# Patient Record
Sex: Female | Born: 1975 | Race: White | Hispanic: No | Marital: Married | Smoking: Never smoker
Health system: Southern US, Community
[De-identification: ages and names within clinical notes are randomized; demographics above are authoritative.]

## PROBLEM LIST (undated history)

## (undated) DIAGNOSIS — N943 Premenstrual tension syndrome: Secondary | ICD-10-CM

## (undated) DIAGNOSIS — N946 Dysmenorrhea, unspecified: Secondary | ICD-10-CM

## (undated) DIAGNOSIS — F419 Anxiety disorder, unspecified: Secondary | ICD-10-CM

## (undated) DIAGNOSIS — Z309 Encounter for contraceptive management, unspecified: Secondary | ICD-10-CM

## (undated) DIAGNOSIS — L729 Follicular cyst of the skin and subcutaneous tissue, unspecified: Secondary | ICD-10-CM

## (undated) HISTORY — DX: Follicular cyst of the skin and subcutaneous tissue, unspecified: L72.9

## (undated) HISTORY — PX: NO PAST SURGERIES: SHX2092

## (undated) HISTORY — DX: Anxiety disorder, unspecified: F41.9

## (undated) HISTORY — DX: Encounter for contraceptive management, unspecified: Z30.9

## (undated) HISTORY — DX: Dysmenorrhea, unspecified: N94.6

## (undated) HISTORY — DX: Premenstrual tension syndrome: N94.3

## (undated) HISTORY — PX: FOOT SURGERY: SHX648

---

## 2008-05-05 ENCOUNTER — Ambulatory Visit: Payer: Self-pay | Admitting: Internal Medicine

## 2008-05-26 ENCOUNTER — Ambulatory Visit: Payer: Self-pay | Admitting: Internal Medicine

## 2008-05-26 ENCOUNTER — Ambulatory Visit (HOSPITAL_COMMUNITY): Admission: RE | Admit: 2008-05-26 | Discharge: 2008-05-26 | Payer: Self-pay | Admitting: Internal Medicine

## 2008-07-20 ENCOUNTER — Other Ambulatory Visit: Admission: RE | Admit: 2008-07-20 | Discharge: 2008-07-20 | Payer: Self-pay | Admitting: Obstetrics and Gynecology

## 2008-11-18 ENCOUNTER — Emergency Department (HOSPITAL_COMMUNITY): Admission: EM | Admit: 2008-11-18 | Discharge: 2008-11-18 | Payer: Self-pay | Admitting: Emergency Medicine

## 2009-05-02 ENCOUNTER — Emergency Department (HOSPITAL_COMMUNITY): Admission: EM | Admit: 2009-05-02 | Discharge: 2009-05-02 | Payer: Self-pay | Admitting: Emergency Medicine

## 2009-08-10 ENCOUNTER — Other Ambulatory Visit: Admission: RE | Admit: 2009-08-10 | Discharge: 2009-08-10 | Payer: Self-pay | Admitting: Obstetrics and Gynecology

## 2010-08-27 LAB — CBC
HCT: 39.5 % (ref 36.0–46.0)
Hemoglobin: 13.8 g/dL (ref 12.0–15.0)
MCHC: 34.9 g/dL (ref 30.0–36.0)
MCV: 90.2 fL (ref 78.0–100.0)
RBC: 4.38 MIL/uL (ref 3.87–5.11)
RDW: 13.2 % (ref 11.5–15.5)

## 2010-08-27 LAB — BASIC METABOLIC PANEL
CO2: 27 mEq/L (ref 19–32)
Chloride: 101 mEq/L (ref 96–112)
GFR calc Af Amer: >60 mL/min (ref >60)
Glucose, Bld: 92 mg/dL (ref 70–99)
Potassium: 3.7 mEq/L (ref 3.5–5.1)
Sodium: 135 mEq/L (ref 135–145)

## 2010-08-27 LAB — GLUCOSE, CAPILLARY: Glucose-Capillary: 96 mg/dL (ref 70–99)

## 2010-08-27 LAB — DIFFERENTIAL
Basophils Relative: 0 % (ref 0–1)
Eosinophils Relative: 0 % (ref 0–5)
Monocytes Absolute: 0.5 10*3/uL (ref 0.1–1.0)
Monocytes Relative: 8 % (ref 3–12)
Neutro Abs: 3.9 10*3/uL (ref 1.7–7.7)

## 2010-08-27 LAB — URINALYSIS, ROUTINE W REFLEX MICROSCOPIC
Glucose, UA: NEGATIVE mg/dL
Hgb urine dipstick: NEGATIVE
Ketones, ur: NEGATIVE mg/dL
Protein, ur: NEGATIVE mg/dL
Urobilinogen, UA: 0.2 mg/dL (ref 0.0–1.0)

## 2010-10-02 NOTE — H&P (Signed)
NAME:  Ann Campbell, Ann Campbell                ACCOUNT NO.:  [id number]   MEDICAL RECORD NO.:  [id number]          PATIENT TYPE:  AMB   LOCATION:  DAY                           FACILITY:  [facility]   PHYSICIAN:  R. Roetta Sessions, M.D. DATE OF BIRTH:  1975-09-23   DATE OF ADMISSION:  DATE OF DISCHARGE:  LH                              HISTORY & PHYSICAL   CHIEF COMPLAINT:  History of anal fissure, having issues with it.   HISTORY OF PRESENT ILLNESS:  Ann Campbell is a 35 year old lady, who presents  with a self-referral today for further evaluation of history of anal  fissure.  Approximately 15 months ago after she had her second child,  she developed an anorectal fissure.  She states that this occurred about  2 weeks after giving birth.  She had painful bowel movements at that  time.  She denies having the episiotomy with either one of her vaginal  deliveries.  She tried multiple topical agents without any relief.  Around June 2009, she was referred to a surgeon and had fissurectomy.  Two days afterwards, she developed abscess, which required an I&D.  Around August 2009, she states she had another surgery for abscess  drainage and possibly extension of her fissurectomy.  She states that  she was told she have to consider a colonoscopy to rule out Crohn  disease.  She followed up with her surgeon about a month ago and was  told that she was feeling nicely.  She moved back to the area from  [place] and will need to establish care with a practice.  She states  she continues to have frequent soreness and itching around the anorectal  opening.  She notes it is worse with exercise.  Three weeks ago, she did  have some drainage noted, which she wiped with her toilet tissue.  She  occasionally does have some bright red blood per rectum.  Her bowel  movements has always been quite frequent.  She may have anywhere from 2-  4 stools daily, but they are usually soft.  Denies any fecal  incontinence.  No pain with  defecation.  No abdominal pain, nausea, or  vomiting.  She has intermittent heartburn due to dietary indiscretions.  She takes Zantac occasionally.  Her weight has been stable.   CURRENT MEDICATIONS:  Zoloft 25 mg daily, multivitamin daily, and Zantac  occasionally.   ALLERGIES:  SULFA causes rash.   PAST MEDICAL HISTORY:  Anxiety and depression.   PAST SURGICAL HISTORY:  Anal fissure as above.  She has had a D&C as  well.   FAMILY HISTORY:  Negative for colorectal cancer.   SOCIAL HISTORY:  She is married.  She has 2 children.  Her youngest is  49 months old.  She is employed as a Designer, multimedia.  She is a  nonsmoker.  Occasionally consumes beer or wine.   REVIEW OF SYSTEMS:  GI:  See HPI.  CONSTITUTIONAL:  No weight loss.  CARDIOPULMONARY:  No chest pain, shortness of breath, palpitations, or  cough.  GENITOURINARY:  No dysuria or hematuria.   PHYSICAL  EXAMINATION:  VITAL SIGNS:  Weight 138, height 5 feet 5 inches,  temperature 97.7, blood pressure 100/78, and pulse 72.  GENERAL:  Pleasant, thin, Caucasian female, in no acute distress.  SKIN:  Warm and dry.  No jaundice.  HEENT:  Sclera nonicteric.  Oropharyngeal mucosa moist and pink.  No  lesions, erythema, or exudates.  No lymphadenopathy or thyromegaly.  CHEST:  Lungs are clear to auscultation.  CARDIAC:  Regular rate and rhythm.  Normal S1 and S2.  No murmurs, rubs,  or gallops.  ABDOMEN:  Positive bowel sound.  Abdomen is soft, nontender, and  nondistended.  No organomegaly or masses.  No rebound or guarding.  No  abdominal bruits or hernias.  RECTAL:  Small skin tag at 12 o'clock.  At 6 o'clock, there is a prior  fissurectomy site with evidence of sphincterotomy with some weakness at  that area.  With mild digital pressure, anorectal tissue was apparent  and is beefy red and bleeds easily.  There seems to be a small pocket at  this area prior to entering into the rectum.  Otherwise, rectum is  normal.  Some  engorged blood on the glove after exam.  She has  tenderness to palpation at the prior fissurectomy site.  LOWER EXTREMITIES:  No edema.   IMPRESSION:  Ann Campbell is a 35 year old lady with history of anorectal  fissure requiring fissurectomy, complicated by abscess development,  requiring drainage and by description, extension of the fissurectomy.  She continues to have ongoing issues with intermittent bleeding and  drainage as well as soreness and itching.  Bowel movements are regular  up to 2-4 times a day with soft stools.  She does not give any history  of fistulas.  Given ongoing symptoms and intermittent bright blood per  rectum, Dr. Jena Gauss has advised colonoscopy to rule out any other  underlying etiologies such as inflammatory bowel disease.  If her  colonoscopy is unremarkable, she may need to have a referral to surgeon  here likely for further evaluation.   PLAN:  1. Colonoscopy with Dr. Jena Gauss in the near future.  2. AnaMantle HC Forte apply anorectally b.i.d. p.r.n. anal discomfort,      #28, 0 refills.  3. Further recommendation is to follow.      Tana Coast, P.AJonathon Bellows, M.D.  Electronically Signed    LL/MEDQ  D:  05/05/2008  T:  05/06/2008  Job:  [id number]   cc:   Kingsley Callander. Ouida Sills, MD  Fax: [phone]

## 2010-10-02 NOTE — Op Note (Signed)
NAME:  Ann Campbell, Ann Campbell                ACCOUNT NO.:  [id number]   MEDICAL RECORD NO.:  [id number]          PATIENT TYPE:  AMB   LOCATION:  DAY                           FACILITY:  [facility]   PHYSICIAN:  R. Roetta Sessions, M.D. DATE OF BIRTH:  04-May-1976   DATE OF PROCEDURE:  05/26/2008  DATE OF DISCHARGE:                               OPERATIVE REPORT   INDICATIONS FOR PROCEDURE:  A 35 year old lady with intermittent rectal  bleeding and pain after having a fissurectomy and drainage,  postoperative abscess in [place].  She continues to have symptoms of  bleeding and drainage and perianal soreness.  We saw her in the office  on May 05, 2008, and started on some AnaMantle Forte, which is  modestly improved her symptoms.  She is here for colonoscopy, rule out  other causes of her rectal bleeding.  This approach has been discussed  with the patient at length.  Risks, benefits, alternatives, and  limitations have been discussed.  Please see the documentation in the  medical record.   PROCEDURE NOTE:  O2 saturation, blood pressure, pulse, and respirations  were monitored throughout the entire procedure.   CONSCIOUS SEDATION:  Versed 5 mg IV and Demerol 125 mg IV in divided  doses.   INSTRUMENT:  Pentax video chip system.   FINDINGS:  Digital rectal exam revealed a small external tag.  The  digital exam was exclusively tender.  For Ms. Ferd Glassing, no mass  appreciated.  Endoscopic Findings:  The prep was good.  Colon:  Colonic  mucosa was surveyed from the rectosigmoid junction through the left  transverse right colon, appendiceal orifice, ileocecal valve, and cecum.  These structures were well seen and photographed for the record.  Terminal ileum was intubated 10 cm from this level, scope was slowly and  cautiously withdrawn.  All previously mentioned mucosal surfaces were  again seen.  The colonic mucosa appeared entirely normal as did in  terminal ileum mucosa.  Scope was pulled down  the rectum.  Thorough  examination of the rectal mucosa including retroflex view of anal verge  demonstrated a couple of anal papillae.  Otherwise, rectal mucosa  appeared entirely normal.  En face view of the anal canal demonstrated  some postoperative changes only.  The patient tolerated the procedure  very well and was reactive in Endoscopy.   IMPRESSION:  Anal papilla, single external hemorrhoidal tag, exclusively  tender anal canal (persisting fissure, infectious process not excluded  at this time).  Otherwise normal rectum:  Terminal ileum.   Today's findings are reassuring.  She does not have any pathology up in  her rectum:  Terminal ileum.  I do not believe, there is no evidence of  Crohn disease.  At this time, I suspect that she is having ongoing  issues with  complicated anal fissure surgery as previously discussed if her  colonoscopy is unremarkable, I feel the best approach was fairly to get  her to see a surgeon locally.  We will see if we can get her to see Dr.  Lovell Sheehan in regards to management of postoperative anal  fissure issues.      Jonathon Bellows, M.D.  Electronically Signed     RMR/MEDQ  D:  05/26/2008  T:  05/26/2008  Job:  [id number]   cc:   Kingsley Callander. Ouida Sills, MD  Fax: [phone]   Dalia Heading, M.D.  Fax: [phone]

## 2012-02-24 ENCOUNTER — Other Ambulatory Visit: Payer: Self-pay | Admitting: Adult Health

## 2012-02-24 ENCOUNTER — Other Ambulatory Visit (HOSPITAL_COMMUNITY)
Admission: RE | Admit: 2012-02-24 | Discharge: 2012-02-24 | Disposition: A | Discharge status: Home / Self Care | Payer: Managed Care, Other (non HMO) | Source: Ambulatory Visit | Attending: Obstetrics and Gynecology | Admitting: Obstetrics and Gynecology

## 2012-02-24 DIAGNOSIS — Z01419 Encounter for gynecological examination (general) (routine) without abnormal findings: Secondary | ICD-10-CM | POA: Insufficient documentation

## 2012-12-30 ENCOUNTER — Emergency Department (HOSPITAL_COMMUNITY)
Admission: EM | Admit: 2012-12-30 | Discharge: 2012-12-30 | Disposition: A | Discharge status: Home / Self Care | Payer: BC Managed Care – PPO | Attending: Emergency Medicine | Admitting: Emergency Medicine

## 2012-12-30 ENCOUNTER — Encounter (HOSPITAL_COMMUNITY): Payer: Self-pay | Admitting: Emergency Medicine

## 2012-12-30 DIAGNOSIS — Y929 Unspecified place or not applicable: Secondary | ICD-10-CM | POA: Insufficient documentation

## 2012-12-30 DIAGNOSIS — Z79899 Other long term (current) drug therapy: Secondary | ICD-10-CM | POA: Insufficient documentation

## 2012-12-30 DIAGNOSIS — L299 Pruritus, unspecified: Secondary | ICD-10-CM | POA: Insufficient documentation

## 2012-12-30 DIAGNOSIS — T63461A Toxic effect of venom of wasps, accidental (unintentional), initial encounter: Secondary | ICD-10-CM | POA: Insufficient documentation

## 2012-12-30 DIAGNOSIS — T6391XA Toxic effect of contact with unspecified venomous animal, accidental (unintentional), initial encounter: Secondary | ICD-10-CM | POA: Insufficient documentation

## 2012-12-30 DIAGNOSIS — Y939 Activity, unspecified: Secondary | ICD-10-CM | POA: Insufficient documentation

## 2012-12-30 DIAGNOSIS — R110 Nausea: Secondary | ICD-10-CM | POA: Insufficient documentation

## 2012-12-30 DIAGNOSIS — IMO0002 Reserved for concepts with insufficient information to code with codable children: Secondary | ICD-10-CM | POA: Insufficient documentation

## 2012-12-30 MED ORDER — DIPHENHYDRAMINE HCL 50 MG/ML IJ SOLN
INTRAMUSCULAR | Status: AC
  Administered 2012-12-30: 25 mg
  Filled 2012-12-30: qty 1

## 2012-12-30 MED ORDER — EPINEPHRINE 0.3 MG/0.3ML IJ SOAJ: 0.3000 mg | INTRAMUSCULAR | Status: DC | PRN

## 2012-12-30 MED ORDER — DIPHENHYDRAMINE HCL 25 MG PO TABS: 25.0000 mg | ORAL_TABLET | Freq: Four times a day (QID) | ORAL | Status: DC

## 2012-12-30 MED ORDER — FAMOTIDINE IN NACL 20-0.9 MG/50ML-% IV SOLN
INTRAVENOUS | Status: AC
  Administered 2012-12-30: 20 mg
  Filled 2012-12-30: qty 50

## 2012-12-30 MED ORDER — ONDANSETRON HCL 4 MG/2ML IJ SOLN
4.0000 mg | Freq: Once | INTRAMUSCULAR | Status: AC
  Administered 2012-12-30: 4 mg via INTRAVENOUS
  Filled 2012-12-30: qty 2

## 2012-12-30 MED ORDER — PREDNISONE 20 MG PO TABS: ORAL_TABLET | ORAL | Status: DC

## 2012-12-30 MED ORDER — RANITIDINE HCL 150 MG PO TABS: 150.0000 mg | ORAL_TABLET | Freq: Two times a day (BID) | ORAL | Status: DC

## 2012-12-30 MED ORDER — METHYLPREDNISOLONE SODIUM SUCC 125 MG IJ SOLR
INTRAMUSCULAR | Status: AC
  Administered 2012-12-30: 125 mg
  Filled 2012-12-30: qty 2

## 2012-12-30 MED ORDER — EPINEPHRINE 0.3 MG/0.3ML IJ SOAJ
0.3000 mg | Freq: Once | INTRAMUSCULAR | Status: AC
  Administered 2012-12-30: 0.3 mg via INTRAMUSCULAR
  Filled 2012-12-30: qty 0.3

## 2012-12-30 MED ORDER — SODIUM CHLORIDE 0.9 % IV BOLUS (SEPSIS)
1000.0000 mL | Freq: Once | INTRAVENOUS | Status: AC
  Administered 2012-12-30: 500 mL via INTRAVENOUS

## 2012-12-30 NOTE — ED Notes (Signed)
Pt states stepped on bee to left foot around 5pm per pt. Took about 20ml of childrens benadryl pta. Pt arrived with red bumps and welts all over body. Stated " my throat feels thick" no resp distress or trouble swallowing but states it is a little hard to swallow. Nad.

## 2012-12-30 NOTE — ED Notes (Signed)
Pt denies complaints at present time.  Awaiting discharge.

## 2012-12-30 NOTE — ED Notes (Signed)
Other bag hanging for full bolus. Pt rash almost gone.

## 2012-12-30 NOTE — ED Provider Notes (Signed)
CSN: [id number]     Arrival date & time 12/30/12  1749 History     First MD Initiated Contact with Patient 12/30/12 1800     Chief Complaint  Patient presents with  . Allergic Reaction   (Consider location/radiation/quality/duration/timing/severity/associated sxs/prior Treatment) Patient is a 37 y.o. female presenting with allergic reaction. The history is provided by the patient. No language interpreter was used.  Allergic Reaction Presenting symptoms: itching and rash   Presenting symptoms: no wheezing   Presenting symptoms comment:  Sensation of throat closing, nausea Itching:    Location:  Full body   Severity:  Severe   Onset quality:  Sudden   Duration:  1 hour   Timing:  Constant   Progression:  Unchanged Rash:    Location:  Full body   Quality: itchiness and redness     Onset quality:  Sudden   Duration:  1 hour   Timing:  Constant   Progression:  Unchanged Severity:  Severe Prior allergic episodes:  No prior episodes Context: insect bite/sting (stepped on a bee)   Relieved by:  Nothing Worsened by:  Nothing tried Ineffective treatments: PO benadryl.   History reviewed. No pertinent past medical history. History reviewed. No pertinent past surgical history. History reviewed. No pertinent family history. History  Substance Use Topics  . Smoking status: Never Smoker   . Smokeless tobacco: Not on file  . Alcohol Use: Yes     Comment: occ   OB History   Grav Para Term Preterm Abortions TAB SAB Ect Mult Living                 Review of Systems  Constitutional: Negative for fever, chills, diaphoresis, activity change, appetite change and fatigue.  HENT: Negative for congestion, sore throat, facial swelling, rhinorrhea, neck pain and neck stiffness.   Eyes: Negative for photophobia and discharge.  Respiratory: Negative for cough, chest tightness, shortness of breath and wheezing.   Cardiovascular: Negative for chest pain, palpitations and leg swelling.   Gastrointestinal: Positive for nausea. Negative for vomiting, abdominal pain and diarrhea.  Endocrine: Negative for polydipsia and polyuria.  Genitourinary: Negative for dysuria, frequency, difficulty urinating and pelvic pain.  Musculoskeletal: Negative for back pain and arthralgias.  Skin: Positive for itching and rash. Negative for color change and wound.  Allergic/Immunologic: Negative for immunocompromised state.  Neurological: Negative for facial asymmetry, weakness, numbness and headaches.  Hematological: Does not bruise/bleed easily.  Psychiatric/Behavioral: Negative for confusion and agitation.    Allergies  Sulfa antibiotics  Home Medications   Current Outpatient Rx  Name  Route  Sig  Dispense  Refill  . sertraline (ZOLOFT) 25 MG tablet   Oral   Take 25 mg by mouth daily.         . diphenhydrAMINE (BENADRYL) 25 MG tablet   Oral   Take 1 tablet (25 mg total) by mouth every 6 (six) hours. For three days   20 tablet   0   . EPINEPHrine (EPIPEN) 0.3 mg/0.3 mL SOAJ injection   Intramuscular   Inject 0.3 mL (0.3 mg total) into the muscle as needed.   2 Device   2   . predniSONE (DELTASONE) 20 MG tablet      2 tabs po daily x 3 days   6 tablet   0   . ranitidine (ZANTAC) 150 MG tablet   Oral   Take 1 tablet (150 mg total) by mouth 2 (two) times daily.   6 tablet  0    BP 108/69  Pulse 86  Temp(Src) 97.8 F (36.6 C) (Oral)  Resp 20  Ht 5\' 5"  (1.651 m)  Wt 140 lb (63.504 kg)  BMI 23.3 kg/m2  SpO2 97%  LMP 12/16/2012 Physical Exam  Constitutional: She is oriented to person, place, and time. She appears well-developed and well-nourished. No distress.  HENT:  Head: Normocephalic and atraumatic.  Mouth/Throat: No oropharyngeal exudate.  Eyes: Pupils are equal, round, and reactive to light.  Neck: Normal range of motion. Neck supple.  Cardiovascular: Normal rate, regular rhythm and normal heart sounds.  Exam reveals no gallop and no friction rub.   No  murmur heard. Pulmonary/Chest: Effort normal and breath sounds normal. No respiratory distress. She has no decreased breath sounds. She has no wheezes. She has no rhonchi. She has no rales.  Abdominal: Soft. Bowel sounds are normal. She exhibits no distension and no mass. There is no tenderness. There is no rebound and no guarding.  Musculoskeletal: Normal range of motion. She exhibits no edema and no tenderness.  Neurological: She is alert and oriented to person, place, and time.  Skin: Skin is warm and dry. Rash noted. Rash is urticarial (generalized).  Psychiatric: She has a normal mood and affect.    ED Course   Procedures (including critical care time)  Labs Reviewed - No data to display No results found. 1. Bee sting-induced anaphylaxis, initial encounter     9:53 PM Pt feeling well, rash much improved.  No ab pain, n/v, throat swelling.    MDM  Pt is a 37 y.o. female with Pmhx as above who presents with generalized pruritic hives, sensation of throat swelling, and upset stomach after stepping on a bee about 1 hr ago.  Initial VS stable, lungs clear, no stridor, abdomen non-tender, oropharynx clear.  Urticaria is generalized.  Given multiple organ systems seem to be involved, will treat for anaphylaxis. Have ordered IM epipen, benadryl, solumedrol, pepcid, 1L NS, zofran which lead to resolution of symptoms.  Pt monitored for 4 hours w/o return of symptoms.  Rx given for epipen, as well as 3 day course of prednisone, zantac, benadryl.  Strict return precautions given for returning symptoms.  Pt will f/u with PCP to discuss formal allergy testing.    1. Bee sting-induced anaphylaxis, initial encounter       Shanna Cisco, MD 12/30/12 2156

## 2012-12-30 NOTE — ED Notes (Signed)
Patient is resting comfortably.  No distress noted.  Pt reporting improvement in rash.  No itching or SOB at present time.  Tolerating p.o fluids with no difficulty.  VS stable.  Denies needs at present time.

## 2012-12-30 NOTE — Discharge Instructions (Signed)
Epinephrine injection (Auto-injector) What is this medicine? EPINEPHRINE (ep i NEF rin) is used for the emergency treatment of severe allergic reactions. You should keep this medicine with you at all times. This medicine may be used for other purposes; ask your health care provider or pharmacist if you have questions. What should I tell my health care provider before I take this medicine? They need to know if you have any of the following conditions: -an unusual or allergic reaction to epinephrine, sulfites, other medicines, foods, dyes, or preservatives -pregnant or trying to get pregnant -breast-feeding How should I use this medicine? This medicine is for injection into the outer thigh. Your doctor or health care professional will instruct you on the proper use of the device during an emergency. Read all directions carefully and make sure you understand them. Do not use more often than directed. Talk to your pediatrician regarding the use of this medicine in children. Special care may be needed. This drug is commonly used in children. A special device is available for use in children. Overdosage: If you think you have taken too much of this medicine contact a poison control center or emergency room at once. NOTE: This medicine is only for you. Do not share this medicine with others. What if I miss a dose? This does not apply. You should only use this medicine for an allergic reaction. What may interact with this medicine? This medicine is only used during an emergency. Significant drug interactions are not likely during emergency use. This list may not describe all possible interactions. Give your health care provider a list of all the medicines, herbs, non-prescription drugs, or dietary supplements you use. Also tell them if you smoke, drink alcohol, or use illegal drugs. Some items may interact with your medicine. What should I watch for while using this medicine? Keep this medicine ready for  use in the case of a severe allergic reaction. Make sure that you have the phone number of your doctor or health care professional and local hospital ready. Remember to check the expiration date of your medicine regularly. You may need to have additional units of this medicine with you at work, school, or other places. Talk to your doctor or health care professional about your need for extra units. Some emergencies may require an additional dose. Check with your doctor or a health care professional before using an extra dose. After use, go to the nearest hospital or call 911. Avoid physical activity. Make sure the treating health care professional knows you have received an injection of this medicine. You will receive additional instructions on what to do during and after use of this medicine before a medical emergency occurs. What side effects may I notice from receiving this medicine? Side effects that you should report to your doctor or health care professional as soon as possible: -allergic reactions like skin rash, itching or hives, swelling of the face, lips, or tongue -breathing problems -chest pain -flushing -irregular or pounding heartbeat -numbness in fingers or toes -vomiting Side effects that usually do not require medical attention (report to your doctor or health care professional if they continue or are bothersome): -anxiety or nervousness -dizzy, drowsy -dry mouth -headache -increased sweating -nausea -tired, weak This list may not describe all possible side effects. Call your doctor for medical advice about side effects. You may report side effects to FDA at 1-800-FDA-1088. Where should I keep my medicine? Keep out of the reach of children. Store at room temperature between  15 and 30 degrees C (59 and 86 degrees F). Protect from light and heat. The solution should be clear in color. If the solution is discolored or contains particles it must be replaced. Throw away any unused  medicine after the expiration date. Ask your doctor or pharmacist about proper disposal of the injector if it is expired or has been used. Always replace your auto-injector before it expires. NOTE: This sheet is a summary. It may not cover all possible information. If you have questions about this medicine, talk to your doctor, pharmacist, or health care provider.  2012, [employer]/Gold Standard. (09/08/2007 4:32:55 PM)Anaphylactic Reaction An anaphylactic reaction is a sudden, severe allergic reaction that involves the whole body. It can be life threatening. A hospital stay is often required. People with asthma, eczema, or hay fever are slightly more likely to have an anaphylactic reaction. CAUSES  An anaphylactic reaction may be caused by anything to which you are allergic. After being exposed to the allergic substance, your immune system becomes sensitized to it. When you are exposed to that allergic substance again, an allergic reaction can occur. Common causes of an anaphylactic reaction include:  Medicines.  Foods, especially peanuts, wheat, shellfish, milk, and eggs.  Insect bites or stings.  Blood products.  Chemicals, such as dyes, latex, and contrast material used for imaging tests. SYMPTOMS  When an allergic reaction occurs, the body releases histamine and other substances. These substances cause symptoms such as tightening of the airway. Symptoms often develop within seconds or minutes of exposure. Symptoms may include:  Skin rash or hives.  Itching.  Chest tightness.  Swelling of the eyes, tongue, or lips.  Trouble breathing or swallowing.  Lightheadedness or fainting.  Anxiety or confusion.  Stomach pains, vomiting, or diarrhea.  Nasal congestion.  A fast or irregular heartbeat (palpitations). DIAGNOSIS  Diagnosis is based on your history of recent exposure to allergic substances, your symptoms, and a physical exam. Your caregiver may also perform blood or urine  tests to confirm the diagnosis. TREATMENT  Epinephrine medicine is the main treatment for an anaphylactic reaction. Other medicines that may be used for treatment include antihistamines, steroids, and albuterol. In severe cases, fluids and medicine to support blood pressure may be given through an intravenous line (IV). Even if you improve after treatment, you need to be observed to make sure your condition does not get worse. This may require a stay in the hospital. HOME CARE INSTRUCTIONS   Wear a medical alert bracelet or necklace stating your allergy.  You and your family must learn how to use an anaphylaxis kit or give an epinephrine injection to temporarily treat an emergency allergic reaction. Always carry your epinephrine injection or anaphylaxis kit with you. This can be lifesaving if you have a severe reaction.  Do not drive or perform tasks after treatment until the medicines used to treat your reaction have worn off, or until your caregiver says it is okay.  If you have hives or a rash:  Take medicines as directed by your caregiver.  You may use an over-the-counter antihistamine (diphenhydramine) as needed.  Apply cold compresses to the skin or take baths in cool water. Avoid hot baths or showers. SEEK MEDICAL CARE IF:   You develop symptoms of an allergic reaction to a new substance. Symptoms may start right away or minutes later.  You develop a rash, hives, or itching.  You develop new symptoms. SEEK IMMEDIATE MEDICAL CARE IF:   You have swelling of the  mouth, difficulty breathing, or wheezing.  You have a tight feeling in your chest or throat.  You develop hives, swelling, or itching all over your body.  You develop severe vomiting or diarrhea.  You feel faint or pass out. This is an emergency. Use your epinephrine injection or anaphylaxis kit as you have been instructed. Call your local emergency services (911 in [place] Even if you improve after the injection, you  need to be examined at a hospital emergency department. MAKE SURE YOU:   Understand these instructions.  Will watch your condition.  Will get help right away if you are not doing well or get worse. Document Released: 05/06/2005 Document Revised: 11/05/2011 Document Reviewed: 08/07/2011 [facility] Patient Information 2014 [place], [place].

## 2013-03-05 ENCOUNTER — Telehealth: Payer: Self-pay | Admitting: *Deleted

## 2013-03-05 NOTE — Telephone Encounter (Signed)
Pt states has a small lump at collar bone, not hard, movable. Pt has an appt with Cyril Mourning, NP to be evaluated.

## 2013-03-08 ENCOUNTER — Other Ambulatory Visit: Payer: Self-pay | Admitting: Adult Health

## 2013-03-10 ENCOUNTER — Encounter: Payer: Self-pay | Admitting: Adult Health

## 2013-03-10 ENCOUNTER — Ambulatory Visit (INDEPENDENT_AMBULATORY_CARE_PROVIDER_SITE_OTHER): Payer: BC Managed Care – PPO | Admitting: Adult Health

## 2013-03-10 VITALS — BP 130/80 | Ht 65.0 in | Wt 145.0 lb

## 2013-03-10 DIAGNOSIS — F411 Generalized anxiety disorder: Secondary | ICD-10-CM

## 2013-03-10 DIAGNOSIS — F419 Anxiety disorder, unspecified: Secondary | ICD-10-CM

## 2013-03-10 DIAGNOSIS — L729 Follicular cyst of the skin and subcutaneous tissue, unspecified: Secondary | ICD-10-CM

## 2013-03-10 DIAGNOSIS — L723 Sebaceous cyst: Secondary | ICD-10-CM

## 2013-03-10 HISTORY — DX: Follicular cyst of the skin and subcutaneous tissue, unspecified: L72.9

## 2013-03-10 MED ORDER — SERTRALINE HCL 25 MG PO TABS: ORAL_TABLET | ORAL | Status: DC

## 2013-03-10 NOTE — Progress Notes (Signed)
Subjective:     Patient ID: Ann Campbell, female   DOB: 1975-09-07, 37 y.o.   MRN: [id number]  HPI Ann Campbell is a 37 year old white female married in complaining of nodule at right clavicle.and she needs refill on zoloft, which is working well.  Review of Systems See HPI Reviewed past medical,surgical, social and family history. Reviewed medications and allergies.     Objective:   Physical Exam BP 130/80  Ht 5\' 5"  (1.651 m)  Wt 145 lb (65.772 kg)  BMI 24.13 kg/m2  LMP 02/13/2013   Skin warm and dry, has 1.5 cm spongy mobile nodule at right clavicle that moves easily, non tender has been there about 4-5 days. Assessment:     Skin cyst Anxiety     Plan:     Make physical appt in near future Refer to Dr Lovell Sheehan to evaluate Refilled zoloft 25 mg x 1year

## 2013-03-10 NOTE — Patient Instructions (Signed)
Make physical appt in future Refer to Dr Lovell Sheehan to evaluate cyst

## 2013-05-24 ENCOUNTER — Ambulatory Visit (INDEPENDENT_AMBULATORY_CARE_PROVIDER_SITE_OTHER): Payer: BC Managed Care – PPO | Admitting: Obstetrics and Gynecology

## 2013-05-24 ENCOUNTER — Encounter (INDEPENDENT_AMBULATORY_CARE_PROVIDER_SITE_OTHER): Payer: Self-pay

## 2013-05-24 ENCOUNTER — Encounter: Payer: Self-pay | Admitting: Obstetrics and Gynecology

## 2013-05-24 VITALS — BP 110/78 | Ht 65.0 in | Wt 147.0 lb

## 2013-05-24 DIAGNOSIS — N898 Other specified noninflammatory disorders of vagina: Secondary | ICD-10-CM

## 2013-05-24 DIAGNOSIS — R300 Dysuria: Secondary | ICD-10-CM

## 2013-05-24 LAB — POCT URINALYSIS DIPSTICK
Blood, UA: NEGATIVE
Glucose, UA: NEGATIVE
KETONES UA: NEGATIVE
LEUKOCYTES UA: NEGATIVE
NITRITE UA: NEGATIVE
PROTEIN UA: NEGATIVE

## 2013-05-24 LAB — POCT WET PREP WITH KOH
Bacteria Wet Prep HPF POC: NEGATIVE
KOH PREP POC: NEGATIVE
TRICHOMONAS UA: NEGATIVE
YEAST WET PREP PER HPF POC: NEGATIVE

## 2013-05-24 MED ORDER — PHENAZOPYRIDINE HCL 200 MG PO TABS: 200.0000 mg | ORAL_TABLET | Freq: Three times a day (TID) | ORAL | Status: DC | PRN

## 2013-05-24 MED ORDER — NITROFURANTOIN MONOHYD MACRO 100 MG PO CAPS: 100.0000 mg | ORAL_CAPSULE | Freq: Two times a day (BID) | ORAL | Status: DC

## 2013-05-24 NOTE — Progress Notes (Signed)
Subjective:     Patient ID: Ann Campbell, female   DOB: [birth year], 38 y.o.   MRN: [id number]  HPI  Ann Campbell is a 38 y.o. female who presents with new episodes of frequency that began in the last few days. She states having a history of similar symptoms in the past, especially when she takes baths. She reports being sexually active in the last few days but denies experiencing any dyspareunia. She states her birth control method is condoms. She denies dysuria.    Review of Systems  Genitourinary: Positive for frequency. Negative for dysuria, difficulty urinating, vaginal pain, menstrual problem, pelvic pain and dyspareunia.   Past Medical History  Diagnosis Date  . Anxiety   . Skin cyst 03/10/2013    Right clavicle area very mobile   Past Surgical History  Procedure Laterality Date  . No past surgeries      Prior to Admission medications   Medication Sig Start Date End Date Taking? Authorizing Provider  EPINEPHrine (EPIPEN) 0.3 mg/0.3 mL SOAJ injection Inject 0.3 mL (0.3 mg total) into the muscle as needed. 12/30/12  Yes Neta Ehlers, MD  sertraline (ZOLOFT) 25 MG tablet TAKE ONE TABLET BY MOUTH ONCE DAILY 03/10/13  Yes Estill Dooms, NP  diphenhydrAMINE (BENADRYL) 25 MG tablet Take 1 tablet (25 mg total) by mouth every 6 (six) hours. For three days 12/30/12 01/02/13  Neta Ehlers, MD   Allergies  Allergen Reactions  . Bee Venom   . Sulfa Antibiotics         Objective:   Physical Exam  Nursing note and vitals reviewed. Constitutional: She is oriented to person, place, and time. She appears well-developed and well-nourished.  HENT:  Head: Normocephalic and atraumatic.  Cardiovascular: Normal rate.   Pulmonary/Chest: Effort normal. No respiratory distress.  Abdominal: She exhibits no distension.  Genitourinary: Vagina normal and uterus normal. No vaginal discharge found.  Bladder slightly sensitive. Uterus, tubes and ovaries are non tender and normal.    Neurological: She is alert and oriented to person, place, and time.  Skin: Skin is warm and dry.  Psychiatric: She has a normal mood and affect.   KOH negative for yeast. Wet prep negative.       Assessment:     Early UTI    Plan:     Plan to prescribe Macrobid 100 mg twice daily.

## 2013-05-24 NOTE — Patient Instructions (Signed)
Rx for pyridium and macrobid to be called to [facility]

## 2014-03-21 ENCOUNTER — Encounter: Payer: Self-pay | Admitting: Obstetrics and Gynecology

## 2014-04-15 ENCOUNTER — Other Ambulatory Visit: Payer: Self-pay | Admitting: Adult Health

## 2014-07-20 ENCOUNTER — Other Ambulatory Visit (HOSPITAL_COMMUNITY)
Admission: RE | Admit: 2014-07-20 | Discharge: 2014-07-20 | Disposition: A | Discharge status: Home / Self Care | Payer: BLUE CROSS/BLUE SHIELD | Source: Ambulatory Visit | Attending: Adult Health | Admitting: Adult Health

## 2014-07-20 ENCOUNTER — Ambulatory Visit (INDEPENDENT_AMBULATORY_CARE_PROVIDER_SITE_OTHER): Payer: BLUE CROSS/BLUE SHIELD | Admitting: Adult Health

## 2014-07-20 ENCOUNTER — Encounter: Payer: Self-pay | Admitting: Adult Health

## 2014-07-20 VITALS — BP 130/80 | HR 74 | Ht 64.75 in | Wt 159.0 lb

## 2014-07-20 DIAGNOSIS — F419 Anxiety disorder, unspecified: Secondary | ICD-10-CM

## 2014-07-20 DIAGNOSIS — Z3041 Encounter for surveillance of contraceptive pills: Secondary | ICD-10-CM | POA: Insufficient documentation

## 2014-07-20 DIAGNOSIS — Z309 Encounter for contraceptive management, unspecified: Secondary | ICD-10-CM

## 2014-07-20 DIAGNOSIS — Z1151 Encounter for screening for human papillomavirus (HPV): Secondary | ICD-10-CM | POA: Insufficient documentation

## 2014-07-20 DIAGNOSIS — Z01419 Encounter for gynecological examination (general) (routine) without abnormal findings: Secondary | ICD-10-CM | POA: Diagnosis not present

## 2014-07-20 DIAGNOSIS — Z30011 Encounter for initial prescription of contraceptive pills: Secondary | ICD-10-CM

## 2014-07-20 DIAGNOSIS — N943 Premenstrual tension syndrome: Secondary | ICD-10-CM

## 2014-07-20 DIAGNOSIS — N946 Dysmenorrhea, unspecified: Secondary | ICD-10-CM

## 2014-07-20 HISTORY — DX: Dysmenorrhea, unspecified: N94.6

## 2014-07-20 HISTORY — DX: Premenstrual tension syndrome: N94.3

## 2014-07-20 HISTORY — DX: Encounter for contraceptive management, unspecified: Z30.9

## 2014-07-20 MED ORDER — SERTRALINE HCL 50 MG PO TABS: 50.0000 mg | ORAL_TABLET | Freq: Every day | ORAL | Status: DC

## 2014-07-20 MED ORDER — SERTRALINE HCL 25 MG PO TABS: 25.0000 mg | ORAL_TABLET | Freq: Every day | ORAL | Status: DC

## 2014-07-20 MED ORDER — NORETHIN-ETH ESTRAD-FE BIPHAS 1 MG-10 MCG / 10 MCG PO TABS: 1.0000 | ORAL_TABLET | Freq: Every day | ORAL | Status: DC

## 2014-07-20 MED ORDER — NAPROXEN SODIUM 550 MG PO TABS: 550.0000 mg | ORAL_TABLET | Freq: Two times a day (BID) | ORAL | Status: DC

## 2014-07-20 NOTE — Progress Notes (Signed)
Patient ID: Ann Campbell, female   DOB: 03-Jan-1976, 39 y.o.   MRN: [id number] History of Present Illness: Ann Campbell is a  39 year old white female in for well woman gyn exam and pap.She is complaining of PMS and period cramps, is using condoms.She is doing cross fit 5-6 days a week, and has some SUI, has used poise tampon and it helps.   Current Medications, Allergies, Past Medical History, Past Surgical History, Family History and Social History were reviewed in [employer] record.     Review of Systems: Patient denies any headaches, hearing loss, fatigue, blurred vision, shortness of breath, chest pain, abdominal pain, problems with bowel movements,or intercourse. No joint pain. See HPI for positives.   Physical Exam:BP 130/80 mmHg  Pulse 74  Ht 5' 4.75" (1.645 m)  Wt 159 lb (72.122 kg)  BMI 26.65 kg/m2  LMP 07/18/2014 General:  Well developed, well nourished, no acute distress Skin:  Warm and dry Neck:  Midline trachea, normal thyroid, good ROM, no lymphadenopathy Lungs; Clear to auscultation bilaterally Breast:  No dominant palpable mass, retraction, or nipple discharge Cardiovascular: Regular rate and rhythm Abdomen:  Soft, non tender, no hepatosplenomegaly Pelvic:  External genitalia is normal in appearance, no lesions.  The vagina is normal in appearance, has period like blood. Urethra has no lesions or masses. The cervix is bulbous,pap with HPV performed.  Uterus is felt to be normal size, shape, and contour.  No adnexal masses or tenderness noted.Bladder is non tender, no masses felt. Extremities/musculoskeletal:  No swelling or varicosities noted, no clubbing or cyanosis Psych:  No mood changes, alert and cooperative,seems happy Discussed increasing zoloft and trying OCs and she agrees.  Impression: Well woman gyn exam with pap PMS  Period cramps Contraceptive management Anxiety     Plan: Rx lo loestrin take 1 daily with 11 refills,start Sunday use  condoms Rx anaprox ds #60 1 bid with 1 refill Will increase zoloft to 50 mg #30 1 daily with 11 refills Physical in 1 year Mammogram at 40 Check CBC,CMP,TSH and lipids,will talk when labs back Review handout on PMS and cramps Try kegels

## 2014-07-20 NOTE — Patient Instructions (Signed)
Dysmenorrhea Menstrual cramps (dysmenorrhea) are caused by the muscles of the uterus tightening (contracting) during a menstrual period. For some women, this discomfort is merely bothersome. For others, dysmenorrhea can be severe enough to interfere with everyday activities for a few days each month. Primary dysmenorrhea is menstrual cramps that last a couple of days when you start having menstrual periods or soon after. This often begins after a teenager starts having her period. As a woman gets older or has a baby, the cramps will usually lessen or disappear. Secondary dysmenorrhea begins later in life, lasts longer, and the pain may be stronger than primary dysmenorrhea. The pain may start before the period and last a few days after the period.  CAUSES  Dysmenorrhea is usually caused by an underlying problem, such as:  The tissue lining the uterus grows outside of the uterus in other areas of the body (endometriosis).  The endometrial tissue, which normally lines the uterus, is found in or grows into the muscular walls of the uterus (adenomyosis).  The pelvic blood vessels are engorged with blood just before the menstrual period (pelvic congestive syndrome).  Overgrowth of cells (polyps) in the lining of the uterus or cervix.  Falling down of the uterus (prolapse) because of loose or stretched ligaments.  Depression.  Bladder problems, infection, or inflammation.  Problems with the intestine, a tumor, or irritable bowel syndrome.  Cancer of the female organs or bladder.  A severely tipped uterus.  A very tight opening or closed cervix.  Noncancerous tumors of the uterus (fibroids).  Pelvic inflammatory disease (PID).  Pelvic scarring (adhesions) from a previous surgery.  Ovarian cyst.  An intrauterine device (IUD) used for birth control. RISK FACTORS You may be at greater risk of dysmenorrhea if:  You are younger than age 62.  You started puberty early.  You have  irregular or heavy bleeding.  You have never given birth.  You have a family history of this problem.  You are a smoker. SIGNS AND SYMPTOMS   Cramping or throbbing pain in your lower abdomen.  Headaches.  Lower back pain.  Nausea or vomiting.  Diarrhea.  Sweating or dizziness.  Loose stools. DIAGNOSIS  A diagnosis is based on your history, symptoms, physical exam, diagnostic tests, or procedures. Diagnostic tests or procedures may include:  Blood tests.  Ultrasonography.  An examination of the lining of the uterus (dilation and curettage, D&C).  An examination inside your abdomen or pelvis with a scope (laparoscopy).  X-rays.  CT scan.  MRI.  An examination inside the bladder with a scope (cystoscopy).  An examination inside the intestine or stomach with a scope (colonoscopy, gastroscopy). TREATMENT  Treatment depends on the cause of the dysmenorrhea. Treatment may include:  Pain medicine prescribed by your health care provider.  Birth control pills or an IUD with progesterone hormone in it.  Hormone replacement therapy.  Nonsteroidal anti-inflammatory drugs (NSAIDs). These may help stop the production of prostaglandins.  Surgery to remove adhesions, endometriosis, ovarian cyst, or fibroids.  Removal of the uterus (hysterectomy).  Progesterone shots to stop the menstrual period.  Cutting the nerves on the sacrum that go to the female organs (presacral neurectomy).  Electric current to the sacral nerves (sacral nerve stimulation).  Antidepressant medicine.  Psychiatric therapy, counseling, or group therapy.  Exercise and physical therapy.  Meditation and yoga therapy.  Acupuncture. HOME CARE INSTRUCTIONS   Only take over-the-counter or prescription medicines as directed by your health care provider.  Place a heating pad  or hot water bottle on your lower back or abdomen. Do not sleep with the heating pad.  Use aerobic exercises, walking,  swimming, biking, and other exercises to help lessen the cramping.  Massage to the lower back or abdomen may help.  Stop smoking.  Avoid alcohol and caffeine. SEEK MEDICAL CARE IF:   Your pain does not get better with medicine.  You have pain with sexual intercourse.  Your pain increases and is not controlled with medicines.  You have abnormal vaginal bleeding with your period.  You develop nausea or vomiting with your period that is not controlled with medicine. SEEK IMMEDIATE MEDICAL CARE IF:  You pass out.  Document Released: 05/06/2005 Document Revised: 01/06/2013 Document Reviewed: 10/22/2012 [facility] Patient Information 2015 [place], [place]. This information is not intended to replace advice given to you by your health care provider. Make sure you discuss any questions you have with your health care provider. Premenstrual Syndrome Premenstrual syndrome (PMS) is a condition that consists of physical, emotional, and behavioral symptoms that affect women of childbearing age. PMS occurs 5-14 days before the start of a menstrual period and often recurs in a predictable pattern. The symptoms go away a few days after the menstrual period starts. PMS can interfere in many ways with normal daily activities and can range from mild to severe. When PMS is considered severe, it may be diagnosed as premenstrual dysphoric disorder (PMDD). A small percentage of women are affected by PMS symptoms and an even smaller percentage of those women are affected by PMDD.  CAUSES  The exact cause of PMS is unknown, but it seems to be related to cyclic hormone changes that happen before menstruation. These hormones are thought to affect chemicals in the brain (serotonin) that can influence a person's mood.  SYMPTOMS  Symptoms of PMS recur consistently from month to month and go away completely after the menstrual period starts. The most common emotional or behavioral symptom is mood swings. These mood swings  can be disabling and interfere with normal activities of daily living. Other common symptoms include depression and angry outbursts. Other symptoms may include:   Irritability.  Anxiety.  Crying spells.   Food cravings or appetite changes.   Changes in sexual desire.   Confusion.   Aggression.   Social withdrawal.   Poor concentration. The most common physical symptoms include a sense of bloating, breast pain, headaches, and extreme fatigue. Other physical symptoms include:   Backaches.   Swelling of the hands and feet.   Weight gain.   Hot flashes.  DIAGNOSIS  To make a diagnosis, your caregiver will ask questions to confirm that you are having a pattern of symptoms. Symptoms must:   Be present 5 days before the start of your period and be present at least 3 months in a row.   End within 4 days after your period starts.   Interfere with some of your normal activities.  Other conditions, such as thyroid disease, depression, and migraine headaches must be ruled out before a diagnosis of PMS is confirmed.  TREATMENT  Your caregiver may suggest ways to maintain a healthy lifestyle, such as exercise. Over-the-counter pain relievers may ease cramps, aches, pains, headaches, and breast tenderness. However, selective serotonin reuptake inhibitors (SSRIs) are medicines that are most beneficial in improving PMS if taken in the second half of the monthly cycle. They may be taken on a daily basis. The most effective oral contraceptive pill used for symptoms of PMS is one that  contains the ingredient drospirenone. Taking 4 days off of the pill instead of the usual 7 days also has shown to increase effectiveness.  There are a number of drugs, dietary supplements, vitamins, and water pills (diuretics) which have been suggested to be helpful but have not shown to be of any benefit to improving PMS symptoms.  HOME CARE INSTRUCTIONS   For 2-3 months, write down your symptoms,  their severity, and how long they last. This may help your caregiver prescribe the best treatment for your symptoms.  Exercise regularly as suggested by your caregiver.  Eat a regular, well-balanced diet.  Avoid caffeine, alcohol, and tobacco consumption.  Limit salt and salty foods to lessen bloating and fluid retention.  Get enough sleep. Practice relaxation techniques.  Drink enough fluids to keep your urine clear or pale yellow.  Take medicines as directed by your caregiver.  Limit stress.  Take a multivitamin as directed by your caregiver. Document Released: 05/03/2000 Document Revised: 01/29/2012 Document Reviewed: 09/23/2011 [facility] Patient Information 2015 [place], [place]. This information is not intended to replace advice given to you by your health care provider. Make sure you discuss any questions you have with your health care provider. Start OCs Sunday Use condoms

## 2014-07-21 ENCOUNTER — Telehealth: Payer: Self-pay | Admitting: Adult Health

## 2014-07-21 LAB — CBC
HCT: 37.1 % (ref 34.0–46.6)
HEMOGLOBIN: 12.7 g/dL (ref 11.1–15.9)
MCH: 30.5 pg (ref 26.6–33.0)
MCHC: 34.2 g/dL (ref 31.5–35.7)
MCV: 89 fL (ref 79–97)
PLATELETS: 301 10*3/uL (ref 150–379)
RBC: 4.16 x10E6/uL (ref 3.77–5.28)
RDW: 13.1 % (ref 12.3–15.4)
WBC: 9.9 10*3/uL (ref 3.4–10.8)

## 2014-07-21 LAB — LIPID PANEL
CHOLESTEROL TOTAL: 223 mg/dL — AB (ref 100–199)
Chol/HDL Ratio: 2.4 ratio units (ref 0.0–4.4)
HDL: 94 mg/dL (ref >39)
LDL Calculated: 116 mg/dL — ABNORMAL HIGH (ref 0–99)
TRIGLYCERIDES: 64 mg/dL (ref 0–149)
VLDL Cholesterol Cal: 13 mg/dL (ref 5–40)

## 2014-07-21 LAB — COMPREHENSIVE METABOLIC PANEL
ALBUMIN: 4.3 g/dL (ref 3.5–5.5)
ALT: 20 IU/L (ref 0–32)
AST: 26 IU/L (ref 0–40)
Albumin/Globulin Ratio: 1.6 (ref 1.1–2.5)
Alkaline Phosphatase: 68 IU/L (ref 39–117)
BUN/Creatinine Ratio: 18 (ref 8–20)
BUN: 15 mg/dL (ref 6–20)
Bilirubin Total: 0.2 mg/dL (ref 0.0–1.2)
CALCIUM: 9.7 mg/dL (ref 8.7–10.2)
CHLORIDE: 98 mmol/L (ref 97–108)
CO2: 26 mmol/L (ref 18–29)
Creatinine, Ser: 0.82 mg/dL (ref 0.57–1.00)
GFR calc Af Amer: 105 mL/min/{1.73_m2} (ref >59)
GFR, EST NON AFRICAN AMERICAN: 91 mL/min/{1.73_m2} (ref >59)
GLOBULIN, TOTAL: 2.7 g/dL (ref 1.5–4.5)
Glucose: 84 mg/dL (ref 65–99)
POTASSIUM: 4.5 mmol/L (ref 3.5–5.2)
SODIUM: 139 mmol/L (ref 134–144)
Total Protein: 7 g/dL (ref 6.0–8.5)

## 2014-07-21 LAB — TSH: TSH: 1.31 u[IU]/mL (ref 0.450–4.500)

## 2014-07-21 NOTE — Telephone Encounter (Signed)
Pt aware labs look good 

## 2014-07-22 LAB — CYTOLOGY - PAP

## 2014-08-08 ENCOUNTER — Telehealth: Payer: Self-pay | Admitting: *Deleted

## 2014-08-08 NOTE — Telephone Encounter (Signed)
Left message not usual to spot on new OC, try to wait 3 months to see how it is working

## 2014-09-28 ENCOUNTER — Telehealth: Payer: Self-pay | Admitting: *Deleted

## 2014-09-28 NOTE — Telephone Encounter (Signed)
Spoke with pt. We are working on trying to get the [place]. Pt is requesting 1 sample box of Lo Loestrin. [employer]AG approved. # [id number] A exp 1/17. Pt to come by office and pick up. [place]

## 2014-10-03 ENCOUNTER — Telehealth: Payer: Self-pay | Admitting: *Deleted

## 2014-10-03 NOTE — Telephone Encounter (Signed)
We received notice from [place] that they approved Lo Loestrin Fe from 09/28/14-05/19/38. Pt aware and [facility] in [place] aware. [place]

## 2014-10-18 ENCOUNTER — Ambulatory Visit: Payer: BLUE CROSS/BLUE SHIELD | Admitting: Women's Health

## 2014-10-26 ENCOUNTER — Telehealth: Payer: Self-pay | Admitting: Adult Health

## 2014-10-26 NOTE — Telephone Encounter (Signed)
Left message that there is ano generic call me in am

## 2014-10-26 NOTE — Telephone Encounter (Signed)
Spoke with pt. Pt states the name brand for Lo Loestrin is $70.00. Pt is requesting generic. Please advise. Thanks!! [place]

## 2014-10-26 NOTE — Telephone Encounter (Signed)
Left message x 1. JSY 

## 2014-10-27 ENCOUNTER — Telehealth: Payer: Self-pay | Admitting: Adult Health

## 2014-10-27 MED ORDER — NORETHIN ACE-ETH ESTRAD-FE 1-20 MG-MCG PO TABS: 1.0000 | ORAL_TABLET | Freq: Every day | ORAL | Status: DC

## 2014-10-27 NOTE — Telephone Encounter (Signed)
Lo loestrin costs too much ,can try discount card but will rx junel 1-20 its generic and cheaper

## 2015-06-15 ENCOUNTER — Ambulatory Visit: Payer: BLUE CROSS/BLUE SHIELD | Admitting: Adult Health

## 2015-06-16 ENCOUNTER — Telehealth: Payer: Self-pay | Admitting: Adult Health

## 2015-06-16 MED ORDER — NORETHIN ACE-ETH ESTRAD-FE 1-20 MG-MCG PO TABS: 1.0000 | ORAL_TABLET | Freq: Every day | ORAL | Status: DC

## 2015-06-16 NOTE — Telephone Encounter (Signed)
Refilled junel 1-20

## 2015-06-22 ENCOUNTER — Ambulatory Visit: Payer: BLUE CROSS/BLUE SHIELD | Admitting: Adult Health

## 2015-07-24 ENCOUNTER — Other Ambulatory Visit: Payer: Self-pay | Admitting: Adult Health

## 2015-08-28 DIAGNOSIS — M67472 Ganglion, left ankle and foot: Secondary | ICD-10-CM | POA: Diagnosis not present

## 2015-08-28 DIAGNOSIS — M79672 Pain in left foot: Secondary | ICD-10-CM | POA: Diagnosis not present

## 2015-09-18 ENCOUNTER — Other Ambulatory Visit: Payer: Self-pay | Admitting: Adult Health

## 2015-10-24 DIAGNOSIS — J019 Acute sinusitis, unspecified: Secondary | ICD-10-CM | POA: Diagnosis not present

## 2015-11-23 ENCOUNTER — Other Ambulatory Visit: Payer: Self-pay | Admitting: Adult Health

## 2015-12-21 ENCOUNTER — Other Ambulatory Visit: Payer: Self-pay | Admitting: Adult Health

## 2016-01-04 ENCOUNTER — Other Ambulatory Visit: Payer: BLUE CROSS/BLUE SHIELD | Admitting: Adult Health

## 2016-01-14 ENCOUNTER — Other Ambulatory Visit: Payer: Self-pay | Admitting: Adult Health

## 2016-01-23 ENCOUNTER — Encounter: Payer: Self-pay | Admitting: Adult Health

## 2016-01-23 ENCOUNTER — Ambulatory Visit (INDEPENDENT_AMBULATORY_CARE_PROVIDER_SITE_OTHER): Payer: BLUE CROSS/BLUE SHIELD | Admitting: Adult Health

## 2016-01-23 VITALS — BP 108/70 | HR 86 | Ht 64.5 in | Wt 160.5 lb

## 2016-01-23 DIAGNOSIS — Z3041 Encounter for surveillance of contraceptive pills: Secondary | ICD-10-CM

## 2016-01-23 DIAGNOSIS — N943 Premenstrual tension syndrome: Secondary | ICD-10-CM

## 2016-01-23 DIAGNOSIS — Z01419 Encounter for gynecological examination (general) (routine) without abnormal findings: Secondary | ICD-10-CM

## 2016-01-23 DIAGNOSIS — Z1212 Encounter for screening for malignant neoplasm of rectum: Secondary | ICD-10-CM | POA: Diagnosis not present

## 2016-01-23 LAB — HEMOCCULT GUIAC POC 1CARD (OFFICE): FECAL OCCULT BLD: NEGATIVE

## 2016-01-23 MED ORDER — SERTRALINE HCL 50 MG PO TABS: ORAL_TABLET | ORAL | 12 refills | Status: DC

## 2016-01-23 MED ORDER — NORETHIN ACE-ETH ESTRAD-FE 1-20 MG-MCG PO TABS: 1.0000 | ORAL_TABLET | Freq: Every day | ORAL | 12 refills | Status: DC

## 2016-01-23 NOTE — Patient Instructions (Signed)
Physical in 1 year Pap 2019 Mammogram yearly   [phone]

## 2016-01-23 NOTE — Progress Notes (Signed)
Patient ID: Ann Campbell, female   DOB: 03/11/1976, 40 y.o.   MRN: [id number] History of Present Illness: Ann Campbell is a 40 year old white female, married in for a well woman gyn exam, she had a normal pap 07/20/14, with negative HPV.She is self employed and very active, she runs, does yoga and weights.    Current Medications, Allergies, Past Medical History, Past Surgical History, Family History and Social History were reviewed in [employer] record.     Review of Systems: Patient denies any headaches, hearing loss, fatigue, blurred vision, shortness of breath, chest pain, abdominal pain, problems with bowel movements, urination, or intercourse. No joint pain or mood swings.Periods lite with OCs and PMS better with zoloft.    Physical Exam:BP 108/70 (BP Location: Left Arm, Patient Position: Sitting, Cuff Size: Normal)   Pulse 86   Ht 5' 4.5" (1.638 m)   Wt 160 lb 8 oz (72.8 kg)   BMI 27.12 kg/m  General:  Well developed, well nourished, no acute distress Skin:  Warm and dry,tan Neck:  Midline trachea, normal thyroid, good ROM, no lymphadenopathy Lungs; Clear to auscultation bilaterally Breast:  No dominant palpable mass, retraction, or nipple discharge Cardiovascular: Regular rate and rhythm Abdomen:  Soft, non tender, no hepatosplenomegaly Pelvic:  External genitalia is normal in appearance, no lesions.  The vagina is normal in appearance. Urethra has no lesions or masses. The cervix is bulbous.  Uterus is felt to be normal size, shape, and contour.  No adnexal masses or tenderness noted.Bladder is non tender, no masses felt. Rectal: Good sphincter tone, no polyps, or hemorrhoids felt.  Hemoccult negative. Extremities/musculoskeletal:  No swelling or varicosities noted, no clubbing or cyanosis Psych:  No mood changes, alert and cooperative,seems happy She had normal labs last year.   Impression:  Well woman exam with routine gynecological exam - Plan: POCT occult  blood stool  Encounter for surveillance of contraceptive pills  PMS (premenstrual syndrome)    Plan: Meds ordered this encounter  Medications  . sertraline (ZOLOFT) 50 MG tablet    Sig: TAKE ONE TABLET BY MOUTH ONCE DAILY    Dispense:  30 tablet    Refill:  12    Order Specific Question:   Supervising Provider    Answer:   Elonda Husky, LUTHER H [[id number]]  . norethindrone-ethinyl estradiol (MICROGESTIN FE 1/20) 1-20 MG-MCG tablet    Sig: Take 1 tablet by mouth daily.    Dispense:  28 tablet    Refill:  12    Order Specific Question:   Supervising Provider    Answer:   Florian Buff [[id number]]   Mammogram now and yearly Physical in 1 year, pap in 2019

## 2016-03-20 ENCOUNTER — Other Ambulatory Visit: Payer: Self-pay | Admitting: Adult Health

## 2016-03-20 DIAGNOSIS — Z1231 Encounter for screening mammogram for malignant neoplasm of breast: Secondary | ICD-10-CM

## 2016-04-03 ENCOUNTER — Ambulatory Visit (HOSPITAL_COMMUNITY): Payer: Managed Care, Other (non HMO)

## 2016-04-08 ENCOUNTER — Ambulatory Visit (HOSPITAL_COMMUNITY)
Admission: RE | Admit: 2016-04-08 | Discharge: 2016-04-08 | Disposition: A | Discharge status: Home / Self Care | Payer: BLUE CROSS/BLUE SHIELD | Source: Ambulatory Visit | Attending: Adult Health | Admitting: Adult Health

## 2016-04-08 DIAGNOSIS — Z1231 Encounter for screening mammogram for malignant neoplasm of breast: Secondary | ICD-10-CM

## 2016-06-03 DIAGNOSIS — J019 Acute sinusitis, unspecified: Secondary | ICD-10-CM | POA: Diagnosis not present

## 2016-06-03 DIAGNOSIS — Z23 Encounter for immunization: Secondary | ICD-10-CM | POA: Diagnosis not present

## 2016-10-29 ENCOUNTER — Encounter (HOSPITAL_COMMUNITY): Payer: Self-pay | Admitting: Emergency Medicine

## 2016-10-29 ENCOUNTER — Emergency Department (HOSPITAL_COMMUNITY)
Admission: EM | Admit: 2016-10-29 | Discharge: 2016-10-29 | Disposition: A | Discharge status: Home / Self Care | Payer: BLUE CROSS/BLUE SHIELD | Attending: Emergency Medicine | Admitting: Emergency Medicine

## 2016-10-29 DIAGNOSIS — Y929 Unspecified place or not applicable: Secondary | ICD-10-CM | POA: Diagnosis not present

## 2016-10-29 DIAGNOSIS — T63461A Toxic effect of venom of wasps, accidental (unintentional), initial encounter: Secondary | ICD-10-CM

## 2016-10-29 DIAGNOSIS — Y999 Unspecified external cause status: Secondary | ICD-10-CM | POA: Diagnosis not present

## 2016-10-29 DIAGNOSIS — Z23 Encounter for immunization: Secondary | ICD-10-CM | POA: Diagnosis not present

## 2016-10-29 DIAGNOSIS — Y939 Activity, unspecified: Secondary | ICD-10-CM | POA: Insufficient documentation

## 2016-10-29 DIAGNOSIS — S1096XA Insect bite of unspecified part of neck, initial encounter: Secondary | ICD-10-CM | POA: Diagnosis not present

## 2016-10-29 MED ORDER — TETANUS-DIPHTH-ACELL PERTUSSIS 5-2.5-18.5 LF-MCG/0.5 IM SUSP
0.5000 mL | Freq: Once | INTRAMUSCULAR | Status: AC
  Administered 2016-10-29: 0.5 mL via INTRAMUSCULAR
  Filled 2016-10-29: qty 0.5

## 2016-10-29 MED ORDER — EPINEPHRINE 0.3 MG/0.3ML IJ SOAJ: 0.3000 mg | Freq: Once | INTRAMUSCULAR | 0 refills | Status: AC

## 2016-10-29 NOTE — ED Triage Notes (Signed)
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 Stung by wasp about 1345 today. States usually is hived up with oral swelling by now but wanted to make sure she was going to be ok. Small welt where pt was stung on back of neck. Pt turned up bottle of childrens benadryl when it happened. No oral swelling noted. No hives noted

## 2016-10-29 NOTE — ED Notes (Signed)
Patient given discharge instruction, verbalized understand. Patient ambulatory out of the department.  

## 2016-10-29 NOTE — ED Provider Notes (Signed)
[facility] DEPT Provider Note   CSN: [id number] Arrival date & time: 10/29/16  1420     History   Chief Complaint Chief Complaint  Patient presents with  . Insect Bite    HPI Ann Campbell is a 41 y.o. female. Patient was stung by a wasp on her posterior neck immediately before to p.m. Today. She complained of mild pain at posterior neck. No other associated symptoms. Treated with Benadryl prior to coming here. She feels sleepy. No other symptoms. She denies shortness of breath denies rash denies itching denies difficulty swallowing denies voice change denies sore throat.  HPI  Past Medical History:  Diagnosis Date  . Anxiety   . Contraceptive management 07/20/2014  . Menstrual cramps 07/20/2014  . PMS (premenstrual syndrome) 07/20/2014  . Skin cyst 03/10/2013   Right clavicle area very mobile    Anaphylactic reaction to bee sting Patient Active Problem List   Diagnosis Date Noted  . PMS (premenstrual syndrome) 07/20/2014  . Menstrual cramps 07/20/2014  . Contraceptive management 07/20/2014  . Skin cyst 03/10/2013    Past Surgical History:  Procedure Laterality Date  . NO PAST SURGERIES      OB History    Gravida Para Term Preterm AB Living   4 3     1 3    SAB TAB Ectopic Multiple Live Births   1       3       Home Medications    Prior to Admission medications   Medication Sig Start Date End Date Taking? Authorizing Provider  EPINEPHrine (EPIPEN) 0.3 mg/0.3 mL SOAJ injection Inject 0.3 mL (0.3 mg total) into the muscle as needed. 12/30/12   Ernestina Patches, MD  norethindrone-ethinyl estradiol ([place] FE 1/20) 1-20 MG-MCG tablet Take 1 tablet by mouth daily. 01/23/16   Estill Dooms, NP  sertraline (ZOLOFT) 50 MG tablet TAKE ONE TABLET BY MOUTH ONCE DAILY 01/23/16   Estill Dooms, NP    Family History Family History  Problem Relation Age of Onset  . Heart disease Maternal Grandmother   . Heart disease Maternal Grandfather   . Cancer Paternal  Grandmother        colon  . Fibromyalgia Mother     Social History Social History  Substance Use Topics  . Smoking status: Never Smoker  . Smokeless tobacco: Never Used  . Alcohol use No    no illicit drug use  Allergies   Bee venom and Sulfa antibiotics   Review of Systems Review of Systems  Constitutional: Negative.   HENT: Negative.   Respiratory: Negative.   Cardiovascular: Negative.   Gastrointestinal: Negative.   Musculoskeletal: Negative.   Skin: Positive for wound.       Wasp sting posterior neck  Neurological: Negative.   Psychiatric/Behavioral: Negative.   All other systems reviewed and are negative.    Physical Exam Updated Vital Signs BP (!) 120/95 (BP Location: Right Arm)   Pulse 77   Temp 98.4 F (36.9 C) (Oral)   Resp 17   Ht 5\' 5"  (1.651 m)   Wt 68 kg (150 lb)   LMP 10/08/2016   SpO2 100%   BMI 24.96 kg/m   Physical Exam  Constitutional: She appears well-developed and well-nourished.  HENT:  Head: Normocephalic and atraumatic.  Eyes: Conjunctivae are normal. Pupils are equal, round, and reactive to light.  Neck: Neck supple. No tracheal deviation present. No thyromegaly present.  Cardiovascular: Normal rate and regular rhythm.   No murmur heard. Pulmonary/Chest:  Effort normal and breath sounds normal.  Abdominal: Soft. Bowel sounds are normal. She exhibits no distension. There is no tenderness.  Musculoskeletal: Normal range of motion. She exhibits no edema or tenderness.  Neurological: She is alert. Coordination normal.  Skin: Skin is warm and dry.  No stinger present. Minimally reddened posterior neck  Psychiatric: She has a normal mood and affect.  Nursing note and vitals reviewed.    ED Treatments / Results  Labs (all labs ordered are listed, but only abnormal results are displayed) Labs Reviewed - No data to display  EKG  EKG Interpretation None       Radiology No results found.  Procedures Procedures (including  critical care time)  Medications Ordered in ED Medications  Tdap (BOOSTRIX) injection 0.5 mL (not administered)    Plan prescription for EpiPen as hers recently expired. This is a localized reaction to a wasp sting. No further treatment needed  Initial Impression / Assessment and Plan / ED Course  I have reviewed the triage vital signs and the nursing notes.  Pertinent labs & imaging results that were available during my care of the patient were reviewed by me and considered in my medical decision making (see chart for details).       Final Clinical Impressions(s) / ED Diagnoses  Dx Wasp sting. localized reaction Final diagnoses:  None    New Prescriptions New Prescriptions   No medications on file     Orlie Dakin, MD 10/29/16 1500

## 2016-10-29 NOTE — ED Notes (Signed)
EDP at the bedside.  ?

## 2016-10-29 NOTE — Discharge Instructions (Signed)
Acute develop difficulty speaking, swallowing or breathing or  if your condition worsens for any reason,  use your EpiPen and return to the emergency Department immediately

## 2017-01-15 DIAGNOSIS — J300 Vasomotor rhinitis: Secondary | ICD-10-CM | POA: Diagnosis not present

## 2017-01-15 DIAGNOSIS — T63451D Toxic effect of venom of hornets, accidental (unintentional), subsequent encounter: Secondary | ICD-10-CM | POA: Diagnosis not present

## 2017-01-16 ENCOUNTER — Other Ambulatory Visit: Payer: Self-pay | Admitting: Adult Health

## 2017-02-17 ENCOUNTER — Other Ambulatory Visit: Payer: Self-pay | Admitting: Adult Health

## 2017-02-20 ENCOUNTER — Telehealth: Payer: Self-pay | Admitting: *Deleted

## 2017-02-20 NOTE — Telephone Encounter (Signed)
Left message x 1. JSY 

## 2017-02-21 MED ORDER — SERTRALINE HCL 50 MG PO TABS: ORAL_TABLET | ORAL | 0 refills | Status: DC

## 2017-02-21 NOTE — Telephone Encounter (Signed)
Requesting zoloft refill. Sees [place]

## 2017-02-24 NOTE — Telephone Encounter (Signed)
Left message letting pt know Zoloft was refilled x 1 and she will need an appt for any further refills. [place]

## 2017-03-24 ENCOUNTER — Other Ambulatory Visit: Payer: Self-pay | Admitting: Women's Health

## 2017-03-24 DIAGNOSIS — J019 Acute sinusitis, unspecified: Secondary | ICD-10-CM | POA: Diagnosis not present

## 2017-03-24 DIAGNOSIS — R0981 Nasal congestion: Secondary | ICD-10-CM | POA: Diagnosis not present

## 2017-03-26 ENCOUNTER — Other Ambulatory Visit: Payer: Self-pay | Admitting: Adult Health

## 2017-03-26 ENCOUNTER — Telehealth: Payer: Self-pay | Admitting: Adult Health

## 2017-03-26 ENCOUNTER — Other Ambulatory Visit: Payer: BLUE CROSS/BLUE SHIELD | Admitting: Adult Health

## 2017-03-26 DIAGNOSIS — Z1231 Encounter for screening mammogram for malignant neoplasm of breast: Secondary | ICD-10-CM

## 2017-03-26 NOTE — Telephone Encounter (Signed)
Informed patient refill was sent to [facility]. Verbalized gratitude.

## 2017-04-03 ENCOUNTER — Other Ambulatory Visit: Payer: BLUE CROSS/BLUE SHIELD | Admitting: Adult Health

## 2017-04-16 ENCOUNTER — Ambulatory Visit (HOSPITAL_COMMUNITY)
Admission: RE | Admit: 2017-04-16 | Discharge: 2017-04-16 | Disposition: A | Discharge status: Home / Self Care | Payer: BLUE CROSS/BLUE SHIELD | Source: Ambulatory Visit | Attending: Adult Health | Admitting: Adult Health

## 2017-04-16 DIAGNOSIS — Z1231 Encounter for screening mammogram for malignant neoplasm of breast: Secondary | ICD-10-CM | POA: Insufficient documentation

## 2017-04-21 ENCOUNTER — Ambulatory Visit: Payer: BLUE CROSS/BLUE SHIELD | Admitting: Adult Health

## 2017-04-21 ENCOUNTER — Encounter: Payer: Self-pay | Admitting: Adult Health

## 2017-04-21 VITALS — BP 120/80 | HR 98 | Ht 65.0 in | Wt 161.0 lb

## 2017-04-21 DIAGNOSIS — Z01419 Encounter for gynecological examination (general) (routine) without abnormal findings: Secondary | ICD-10-CM | POA: Insufficient documentation

## 2017-04-21 DIAGNOSIS — Z1212 Encounter for screening for malignant neoplasm of rectum: Secondary | ICD-10-CM | POA: Diagnosis not present

## 2017-04-21 DIAGNOSIS — Z3041 Encounter for surveillance of contraceptive pills: Secondary | ICD-10-CM

## 2017-04-21 DIAGNOSIS — N943 Premenstrual tension syndrome: Secondary | ICD-10-CM

## 2017-04-21 DIAGNOSIS — Z1211 Encounter for screening for malignant neoplasm of colon: Secondary | ICD-10-CM | POA: Diagnosis not present

## 2017-04-21 LAB — HEMOCCULT GUIAC POC 1CARD (OFFICE): FECAL OCCULT BLD: NEGATIVE

## 2017-04-21 MED ORDER — NORETHIN ACE-ETH ESTRAD-FE 1-20 MG-MCG PO TABS: 1.0000 | ORAL_TABLET | Freq: Every day | ORAL | 4 refills | Status: DC

## 2017-04-21 MED ORDER — SERTRALINE HCL 50 MG PO TABS: 50.0000 mg | ORAL_TABLET | Freq: Every day | ORAL | 3 refills | Status: DC

## 2017-04-21 NOTE — Progress Notes (Signed)
Patient ID: Ann Campbell, female   DOB: Oct 30, 1975, 41 y.o.   MRN: [id number] History of Present Illness: Ann Campbell is a 41 year old white female, married, in for a well woman gyn exam, her pap was normal with negative HPV 07/20/14. PCP is Dr Willey Blade.   Current Medications, Allergies, Past Medical History, Past Surgical History, Family History and Social History were reviewed in [employer] record.     Review of Systems:  Patient denies any headaches, hearing loss, fatigue, blurred vision, shortness of breath, chest pain, abdominal pain, problems with bowel movements, urination, or intercourse. No joint pain or mood swings.PMS a times.    Physical Exam:BP 120/80 (BP Location: Left Arm, Patient Position: Sitting, Cuff Size: Small)   Pulse 98   Ht 5\' 5"  (1.651 m)   Wt 161 lb (73 kg)   LMP 04/01/2017   BMI 26.79 kg/m  General:  Well developed, well nourished, no acute distress Skin:  Warm and dry Neck:  Midline trachea, normal thyroid, good ROM, no lymphadenopathy Lungs; Clear to auscultation bilaterally Breast:  No dominant palpable mass, retraction, or nipple discharge Cardiovascular: Regular rate and rhythm Abdomen:  Soft, non tender, no hepatosplenomegaly Pelvic:  External genitalia is normal in appearance, no lesions.  The vagina is normal in appearance. Urethra has no lesions or masses. The cervix is bulbous.  Uterus is felt to be normal size, shape, and contour.  No adnexal masses or tenderness noted.Bladder is non tender, no masses felt. Extremities/musculoskeletal:  No swelling or varicosities noted, no clubbing or cyanosis Psych:  No mood changes, alert and cooperative,seems happy PHQ 9 score 0.  Impression:  1. Well woman exam with routine gynecological exam   2. Encounter for surveillance of contraceptive pills   3. PMS (premenstrual syndrome)   4. Screening for colorectal cancer      Plan:  Meds ordered this encounter  Medications  . sertraline  (ZOLOFT) 50 MG tablet    Sig: Take 1 tablet (50 mg total) by mouth daily.    Dispense:  90 tablet    Refill:  3    Please consider 90 day supplies to promote better adherence    Order Specific Question:   Supervising Provider    Answer:   Elonda Husky, LUTHER H [[id number]]  . norethindrone-ethinyl estradiol (MICROGESTIN FE 1/20) 1-20 MG-MCG tablet    Sig: Take 1 tablet by mouth daily.    Dispense:  84 tablet    Refill:  4    Please consider 90 day supplies to promote better adherence    Order Specific Question:   Supervising Provider    Answer:   Tania Ade H [[id number]]  Pap and physical in 1 year Mammogram yearly Call when ready for fasting labs

## 2017-05-03 DIAGNOSIS — J069 Acute upper respiratory infection, unspecified: Secondary | ICD-10-CM | POA: Diagnosis not present

## 2017-08-01 DIAGNOSIS — T162XXA Foreign body in left ear, initial encounter: Secondary | ICD-10-CM | POA: Diagnosis not present

## 2018-02-18 ENCOUNTER — Telehealth: Payer: Self-pay | Admitting: *Deleted

## 2018-02-18 MED ORDER — PHENAZOPYRIDINE HCL 200 MG PO TABS: 200.0000 mg | ORAL_TABLET | Freq: Three times a day (TID) | ORAL | 0 refills | Status: DC | PRN

## 2018-02-18 MED ORDER — NITROFURANTOIN MONOHYD MACRO 100 MG PO CAPS
100.0000 mg | ORAL_CAPSULE | Freq: Two times a day (BID) | ORAL | 0 refills | Status: DC
Start: 2018-02-18 — End: 2018-05-08

## 2018-02-18 NOTE — Telephone Encounter (Signed)
Has UTI sx of burning, and urgency, will Rx, macrobid and pyridium and push fluids, if not better make appt.

## 2018-02-18 NOTE — Telephone Encounter (Signed)
Patient states she is having burning with urination, frequent urination with small amount and pain.  Knows she has UTI.  Requesting an antibiotic.  Please advise.

## 2018-02-25 DIAGNOSIS — N390 Urinary tract infection, site not specified: Secondary | ICD-10-CM | POA: Diagnosis not present

## 2018-03-03 DIAGNOSIS — D225 Melanocytic nevi of trunk: Secondary | ICD-10-CM | POA: Diagnosis not present

## 2018-03-03 DIAGNOSIS — D220 Melanocytic nevi of lip: Secondary | ICD-10-CM | POA: Diagnosis not present

## 2018-03-03 DIAGNOSIS — D2239 Melanocytic nevi of other parts of face: Secondary | ICD-10-CM | POA: Diagnosis not present

## 2018-03-03 DIAGNOSIS — D2222 Melanocytic nevi of left ear and external auricular canal: Secondary | ICD-10-CM | POA: Diagnosis not present

## 2018-03-11 ENCOUNTER — Other Ambulatory Visit: Payer: Self-pay | Admitting: Adult Health

## 2018-03-11 DIAGNOSIS — Z1231 Encounter for screening mammogram for malignant neoplasm of breast: Secondary | ICD-10-CM

## 2018-04-22 ENCOUNTER — Ambulatory Visit (HOSPITAL_COMMUNITY)
Admission: RE | Admit: 2018-04-22 | Discharge: 2018-04-22 | Disposition: A | Discharge status: Home / Self Care | Payer: BLUE CROSS/BLUE SHIELD | Source: Ambulatory Visit | Attending: Adult Health | Admitting: Adult Health

## 2018-04-22 ENCOUNTER — Ambulatory Visit (HOSPITAL_COMMUNITY): Payer: BLUE CROSS/BLUE SHIELD

## 2018-04-22 DIAGNOSIS — Z1231 Encounter for screening mammogram for malignant neoplasm of breast: Secondary | ICD-10-CM | POA: Insufficient documentation

## 2018-05-04 ENCOUNTER — Other Ambulatory Visit: Payer: Self-pay | Admitting: Adult Health

## 2018-05-04 MED ORDER — NORETHIN ACE-ETH ESTRAD-FE 1-20 MG-MCG PO TABS: 1.0000 | ORAL_TABLET | Freq: Every day | ORAL | 4 refills | Status: DC

## 2018-05-04 NOTE — Progress Notes (Signed)
Refill oCs

## 2018-05-08 ENCOUNTER — Ambulatory Visit: Payer: BLUE CROSS/BLUE SHIELD | Admitting: Adult Health

## 2018-05-08 ENCOUNTER — Other Ambulatory Visit (HOSPITAL_COMMUNITY)
Admission: RE | Admit: 2018-05-08 | Discharge: 2018-05-08 | Disposition: A | Discharge status: Home / Self Care | Payer: BLUE CROSS/BLUE SHIELD | Source: Ambulatory Visit | Attending: Adult Health | Admitting: Adult Health

## 2018-05-08 ENCOUNTER — Encounter: Payer: Self-pay | Admitting: Adult Health

## 2018-05-08 VITALS — BP 122/80 | HR 77 | Ht 64.5 in | Wt 165.5 lb

## 2018-05-08 DIAGNOSIS — Z01419 Encounter for gynecological examination (general) (routine) without abnormal findings: Secondary | ICD-10-CM | POA: Insufficient documentation

## 2018-05-08 DIAGNOSIS — Z1212 Encounter for screening for malignant neoplasm of rectum: Secondary | ICD-10-CM | POA: Diagnosis not present

## 2018-05-08 DIAGNOSIS — Z3041 Encounter for surveillance of contraceptive pills: Secondary | ICD-10-CM

## 2018-05-08 DIAGNOSIS — Z1211 Encounter for screening for malignant neoplasm of colon: Secondary | ICD-10-CM | POA: Diagnosis not present

## 2018-05-08 DIAGNOSIS — N943 Premenstrual tension syndrome: Secondary | ICD-10-CM

## 2018-05-08 LAB — HEMOCCULT GUIAC POC 1CARD (OFFICE): Fecal Occult Blood, POC: NEGATIVE

## 2018-05-08 MED ORDER — NORETHIN ACE-ETH ESTRAD-FE 1-20 MG-MCG PO TABS: 1.0000 | ORAL_TABLET | Freq: Every day | ORAL | 4 refills | Status: DC

## 2018-05-08 MED ORDER — SERTRALINE HCL 50 MG PO TABS: 50.0000 mg | ORAL_TABLET | Freq: Every day | ORAL | 3 refills | Status: DC

## 2018-05-08 NOTE — Progress Notes (Signed)
Patient ID: Ann Campbell, female   DOB: 1975/09/15, 42 y.o.   MRN: [id number] History of Present Illness:  Ann Campbell is a 42 year old white female, married, in for a well woman gyn exam and pap. PCP is Dr Willey Blade.   Current Medications, Allergies, Past Medical History, Past Surgical History, Family History and Social History were reviewed in [employer] record.     Review of Systems: Patient denies any headaches, hearing loss, fatigue, blurred vision, shortness of breath, chest pain, abdominal pain, problems with bowel movements, urination, or intercourse. No joint pain or mood swings.    Physical Exam:BP 122/80 (BP Location: Left Arm, Patient Position: Sitting, Cuff Size: Normal)   Pulse 77   Ht 5' 4.5" (1.638 m)   Wt 165 lb 8 oz (75.1 kg)   LMP 04/22/2018   BMI 27.97 kg/m  General:  Well developed, well nourished, no acute distress Skin:  Warm and dry Neck:  Midline trachea, normal thyroid, good ROM, no lymphadenopathy Lungs; Clear to auscultation bilaterally Breast:  No dominant palpable mass, retraction, or nipple discharge Cardiovascular: Regular rate and rhythm Abdomen:  Soft, non tender, no hepatosplenomegaly Pelvic:  External genitalia is normal in appearance, no lesions.  The vagina is normal in appearance. Urethra has no lesions or masses. The cervix is bulbous.Pap with HPV performed.  Uterus is felt to be normal size, shape, and contour.  No adnexal masses or tenderness noted.Bladder is non tender, no masses felt. Rectal: Good sphincter tone, no polyps, or hemorrhoids felt.  Hemoccult negative. Extremities/musculoskeletal:  No swelling or varicosities noted, no clubbing or cyanosis Psych:  No mood changes, alert and cooperative,seems happy PHQ 2 score 0. Fall risk is low. Examination chaperoned by Levy Pupa LPN.  Impression: 1. Encounter for gynecological examination with Papanicolaou smear of cervix   2. Screening for colorectal cancer   3. Encounter  for surveillance of contraceptive pills   4. PMS (premenstrual syndrome)       Plan: Check CBC,CMP,TSH and lipids Meds ordered this encounter  Medications  . norethindrone-ethinyl estradiol (MICROGESTIN FE 1/20) 1-20 MG-MCG tablet    Sig: Take 1 tablet by mouth daily.    Dispense:  84 tablet    Refill:  4    Please consider 90 day supplies to promote better adherence    Order Specific Question:   Supervising Provider    Answer:   Elonda Husky, LUTHER H [[id number]]  . sertraline (ZOLOFT) 50 MG tablet    Sig: Take 1 tablet (50 mg total) by mouth daily.    Dispense:  90 tablet    Refill:  3    Please consider 90 day supplies to promote better adherence    Order Specific Question:   Supervising Provider    Answer:   Florian Buff [[id number]]  Physical in 1 year  Pap in 3 if normal Mammogram yearly

## 2018-05-14 LAB — CYTOLOGY - PAP
ADEQUACY: ABSENT
DIAGNOSIS: NEGATIVE
HPV (WINDOPATH): NOT DETECTED

## 2019-03-19 ENCOUNTER — Other Ambulatory Visit: Payer: Self-pay

## 2019-03-19 DIAGNOSIS — Z20822 Contact with and (suspected) exposure to covid-19: Secondary | ICD-10-CM

## 2019-03-21 LAB — NOVEL CORONAVIRUS, NAA: SARS-CoV-2, NAA: NOT DETECTED

## 2019-03-23 ENCOUNTER — Other Ambulatory Visit (HOSPITAL_COMMUNITY): Payer: Self-pay | Admitting: Adult Health

## 2019-03-23 DIAGNOSIS — Z1231 Encounter for screening mammogram for malignant neoplasm of breast: Secondary | ICD-10-CM

## 2019-04-07 DIAGNOSIS — D224 Melanocytic nevi of scalp and neck: Secondary | ICD-10-CM | POA: Diagnosis not present

## 2019-04-07 DIAGNOSIS — D2222 Melanocytic nevi of left ear and external auricular canal: Secondary | ICD-10-CM | POA: Diagnosis not present

## 2019-04-07 DIAGNOSIS — D225 Melanocytic nevi of trunk: Secondary | ICD-10-CM | POA: Diagnosis not present

## 2019-04-07 DIAGNOSIS — D2239 Melanocytic nevi of other parts of face: Secondary | ICD-10-CM | POA: Diagnosis not present

## 2019-04-28 ENCOUNTER — Ambulatory Visit (HOSPITAL_COMMUNITY): Payer: BLUE CROSS/BLUE SHIELD

## 2019-04-29 ENCOUNTER — Other Ambulatory Visit: Payer: Self-pay

## 2019-04-29 ENCOUNTER — Ambulatory Visit (HOSPITAL_COMMUNITY)
Admission: RE | Admit: 2019-04-29 | Discharge: 2019-04-29 | Disposition: A | Discharge status: Home / Self Care | Payer: BC Managed Care – PPO | Source: Ambulatory Visit | Attending: Adult Health | Admitting: Adult Health

## 2019-04-29 DIAGNOSIS — Z1231 Encounter for screening mammogram for malignant neoplasm of breast: Secondary | ICD-10-CM | POA: Diagnosis not present

## 2019-05-15 ENCOUNTER — Other Ambulatory Visit: Payer: Self-pay | Admitting: Adult Health

## 2019-05-18 ENCOUNTER — Other Ambulatory Visit: Payer: Self-pay | Admitting: Adult Health

## 2019-05-26 ENCOUNTER — Ambulatory Visit: Payer: 59 | Attending: Internal Medicine

## 2019-05-26 ENCOUNTER — Other Ambulatory Visit: Payer: Self-pay

## 2019-05-26 DIAGNOSIS — Z20822 Contact with and (suspected) exposure to covid-19: Secondary | ICD-10-CM

## 2019-05-27 ENCOUNTER — Other Ambulatory Visit: Payer: Self-pay

## 2019-05-28 LAB — NOVEL CORONAVIRUS, NAA: SARS-CoV-2, NAA: NOT DETECTED

## 2019-08-18 ENCOUNTER — Ambulatory Visit: Payer: 59 | Attending: Internal Medicine

## 2019-08-18 ENCOUNTER — Other Ambulatory Visit: Payer: Self-pay

## 2019-08-18 DIAGNOSIS — Z20822 Contact with and (suspected) exposure to covid-19: Secondary | ICD-10-CM

## 2019-08-19 LAB — NOVEL CORONAVIRUS, NAA: SARS-CoV-2, NAA: NOT DETECTED

## 2019-09-16 ENCOUNTER — Other Ambulatory Visit: Payer: Self-pay | Admitting: Adult Health

## 2019-09-20 ENCOUNTER — Other Ambulatory Visit: Payer: Self-pay | Admitting: Adult Health

## 2019-09-20 MED ORDER — NORETHIN ACE-ETH ESTRAD-FE 1-20 MG-MCG PO TABS: 1.0000 | ORAL_TABLET | Freq: Every day | ORAL | 3 refills | Status: DC

## 2019-09-20 NOTE — Progress Notes (Signed)
Refill junel

## 2019-09-23 ENCOUNTER — Encounter: Payer: Self-pay | Admitting: Podiatry

## 2019-09-23 ENCOUNTER — Ambulatory Visit: Payer: 59 | Admitting: Podiatry

## 2019-09-23 ENCOUNTER — Other Ambulatory Visit: Payer: Self-pay

## 2019-09-23 ENCOUNTER — Ambulatory Visit (INDEPENDENT_AMBULATORY_CARE_PROVIDER_SITE_OTHER): Payer: 59

## 2019-09-23 VITALS — BP 125/86 | HR 69 | Resp 16

## 2019-09-23 DIAGNOSIS — M778 Other enthesopathies, not elsewhere classified: Secondary | ICD-10-CM | POA: Diagnosis not present

## 2019-09-23 DIAGNOSIS — M7751 Other enthesopathy of right foot: Secondary | ICD-10-CM

## 2019-09-23 NOTE — Progress Notes (Signed)
  Subjective:  Patient ID: Ann Campbell, female    DOB: Jan 29, 1976,  MRN: [id number] HPI Chief Complaint  Patient presents with  . 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 New Patient (Initial Visit)    44 y.o. female presents with the above complaint.   ROS: Denies fever chills nausea vomiting muscle aches pains calf pain back pain chest pain shortness of breath.  Past Medical History:  Diagnosis Date  . Anxiety   . Contraceptive management 07/20/2014  . Menstrual cramps 07/20/2014  . PMS (premenstrual syndrome) 07/20/2014  . Skin cyst 03/10/2013   Right clavicle area very mobile   Past Surgical History:  Procedure Laterality Date  . NO PAST SURGERIES      Current Outpatient Medications:  .  loratadine (CLARITIN) 10 MG tablet, Take 10 mg by mouth daily., Disp: , Rfl:  .  norethindrone-ethinyl estradiol (JUNEL FE 1/20) 1-20 MG-MCG tablet, Take 1 tablet by mouth daily., Disp: 84 tablet, Rfl: 3 .  sertraline (ZOLOFT) 50 MG tablet, Take 1 tablet by mouth once daily, Disp: 90 tablet, Rfl: 3  Allergies  Allergen Reactions  . Bee Venom Anaphylaxis and Hives  . Amoxicillin-Pot Clavulanate     Other reaction(s): GI Intolerance  . Sulfa Antibiotics Rash   Review of Systems Objective:   Vitals:   09/23/19 1035  BP: 125/86  Pulse: 69  Resp: 16    General: Well developed, nourished, in no acute distress, alert and oriented x3   Dermatological: Skin is warm, dry and supple bilateral. Nails x 10 are well maintained; remaining integument appears unremarkable at this time. There are no open sores, no preulcerative lesions, no rash or signs of infection present.  Vascular: Dorsalis Pedis artery and Posterior Tibial artery pedal pulses are 2/4 bilateral with immedate capillary fill time. Pedal hair growth present. No varicosities and no lower extremity edema present bilateral.    Neruologic: Grossly intact via light touch bilateral. Vibratory intact via tuning fork bilateral. Protective threshold with Semmes Wienstein monofilament intact to all pedal sites bilateral. Patellar and Achilles deep tendon reflexes 2+ bilateral. No Babinski or clonus noted bilateral.   Musculoskeletal: No gross boney pedal deformities bilateral. No pain, crepitus, or limitation noted with foot and ankle range of motion bilateral. Muscular strength 5/5 in all groups tested bilateral.  She has pain on palpation with a reducible head of the first metatarsal right foot severe tenderness letter deformity is present.  Mild hallux valgus deformity is present.  Toes appear to be rectus for the most part  Gait: Unassisted, Nonantalgic.    Radiographs:  Radiographs taken today demonstrate moderate lateral bowing of her fifth metatarsal and mild hallux valgus deformity.  She has splaying of the forefoot.  Assessment & Plan:   Assessment: Tailor's bunion deformity right painful.  Plan: Discussed etiology pathology conservative surgical therapies at this point time due to the significant lateral bowing is going to consist of a wedge osteotomy from the mid diaphyseal region.  She understands that this will be a nonweightbearing for at least 2 to 3 weeks.  We did discuss pros and cons of surgery she understands this and is amenable to it I will follow-up with her in the near future for surgical intervention.     [place] Pizzimenti T. Duran, [place]

## 2019-09-23 NOTE — Patient Instructions (Signed)
Pre-Operative Instructions  Congratulations, you have decided to take an important step towards improving your quality of life.  You can be assured that the doctors and staff at [facility] will be with you every step of the way.  Here are some important things you should know:  1. Plan to be at the surgery center/hospital at least 1 (one) hour prior to your scheduled time, unless otherwise directed by the surgical center/hospital staff.  You must have a responsible adult accompany you, remain during the surgery and drive you home.  Make sure you have directions to the surgical center/hospital to ensure you arrive on time. 2. If you are having surgery at [facility] or  hospitals, you will need a copy of your medical history and physical form from your family physician within one month prior to the date of surgery. We will give you a form for your primary physician to complete.  3. We make every effort to accommodate the date you request for surgery.  However, there are times where surgery dates or times have to be moved.  We will contact you as soon as possible if a change in schedule is required.   4. No aspirin/ibuprofen for one week before surgery.  If you are on aspirin, any non-steroidal anti-inflammatory medications (Mobic, Aleve, Ibuprofen) should not be taken seven (7) days prior to your surgery.  You make take Tylenol for pain prior to surgery.  5. Medications - If you are taking daily heart and blood pressure medications, seizure, reflux, allergy, asthma, anxiety, pain or diabetes medications, make sure you notify the surgery center/hospital before the day of surgery so they can tell you which medications you should take or avoid the day of surgery. 6. No food or drink after midnight the night before surgery unless directed otherwise by surgical center/hospital staff. 7. No alcoholic beverages 24-hours prior to surgery.  No smoking 24-hours prior or 24-hours after  surgery. 8. Wear loose pants or shorts. They should be loose enough to fit over bandages, boots, and casts. 9. Don't wear slip-on shoes. Sneakers are preferred. 10. Bring your boot with you to the surgery center/hospital.  Also bring crutches or a walker if your physician has prescribed it for you.  If you do not have this equipment, it will be provided for you after surgery. 11. If you have not been contacted by the surgery center/hospital by the day before your surgery, call to confirm the date and time of your surgery. 12. Leave-time from work may vary depending on the type of surgery you have.  Appropriate arrangements should be made prior to surgery with your employer. 13. Prescriptions will be provided immediately following surgery by your doctor.  Fill these as soon as possible after surgery and take the medication as directed. Pain medications will not be refilled on weekends and must be approved by the doctor. 14. Remove nail polish on the operative foot and avoid getting pedicures prior to surgery. 15. Wash the night before surgery.  The night before surgery wash the foot and leg well with water and the antibacterial soap provided. Be sure to pay special attention to beneath the toenails and in between the toes.  Wash for at least three (3) minutes. Rinse thoroughly with water and dry well with a towel.  Perform this wash unless told not to do so by your physician.  Enclosed: 1 Ice pack (please put in freezer the night before surgery)   1 Hibiclens skin cleaner     Pre-op instructions  If you have any questions regarding the instructions, please do not hesitate to call our office.  [place]: [address], , [place] [address] -- [phone]  [place]: [address] [place], [place] [address] -- [phone]  [place]: [address], [place], [place] [address] -- [phone]   Website: [email or web address] 

## 2019-10-28 ENCOUNTER — Encounter: Payer: 59 | Admitting: Podiatry

## 2019-11-04 ENCOUNTER — Encounter: Payer: 59 | Admitting: Podiatry

## 2019-11-18 ENCOUNTER — Encounter: Payer: 59 | Admitting: Podiatry

## 2019-11-27 ENCOUNTER — Encounter: Payer: Self-pay | Admitting: Emergency Medicine

## 2019-11-27 ENCOUNTER — Other Ambulatory Visit: Payer: Self-pay

## 2019-11-27 ENCOUNTER — Ambulatory Visit
Admission: EM | Admit: 2019-11-27 | Discharge: 2019-11-27 | Disposition: A | Discharge status: Home / Self Care | Payer: 59 | Attending: Emergency Medicine | Admitting: Emergency Medicine

## 2019-11-27 DIAGNOSIS — L237 Allergic contact dermatitis due to plants, except food: Secondary | ICD-10-CM | POA: Diagnosis not present

## 2019-11-27 MED ORDER — DEXAMETHASONE SODIUM PHOSPHATE 10 MG/ML IJ SOLN
10.0000 mg | Freq: Once | INTRAMUSCULAR | Status: AC
  Administered 2019-11-27: 10 mg via INTRAMUSCULAR

## 2019-11-27 MED ORDER — TRIAMCINOLONE ACETONIDE 0.1 % EX CREA
1.0000 | TOPICAL_CREAM | Freq: Two times a day (BID) | CUTANEOUS | 0 refills | Status: DC
Start: 2019-11-27 — End: 2020-01-27

## 2019-11-27 MED ORDER — PREDNISONE 20 MG PO TABS: 20.0000 mg | ORAL_TABLET | Freq: Two times a day (BID) | ORAL | 0 refills | Status: AC

## 2019-11-27 NOTE — ED Triage Notes (Signed)
Rash started end of June.  Thought it was poison oak.   Did 10 days of prednisone and now it has came back.  Patch on back of knee and on right arm.

## 2019-11-27 NOTE — Discharge Instructions (Signed)
Wash with warm water and mild soap Steroid shot given in office Prednisone prescribed.  Take as directed and to completion Triamcinolone cream prescribed.  Use as directed Use OTC zyrtec, allegra, or claritin during the day.  Benadryl at night. You may also use OTC hydrocortisone cream and/or calamine lotion to help alleviate itching Follow up with PCP if symptoms persists  Return or go to the ED if you have any new or worsening symptoms such as fever, chills, nausea, vomiting, difficulty breathing, throat swelling, tongue swelling, numbness/ tingling in mouth, worsening symptoms despite treatment, etc..Marland Kitchen

## 2019-11-27 NOTE — ED Provider Notes (Signed)
[place]   [id number] 11/27/19 Arrival Time: 7782  CC: Rash  SUBJECTIVE:  Zollie Clemence is a 44 y.o. female who presents with a poison ivy rash x 2 week.  Symptoms began after white water rafting and had exposure to poison ivy.  Localizes the rash to underneath RT arm and behind LT knee.  Describes it as itchy and red.  Was treated with a prednisone taper which provided temporary relief, but now its flared up again.  Symptoms are made worse with scratching.  Denies fever, chills, nausea, vomiting, swelling, discharge, SOB, chest pain.  ROS: As per HPI.  All other pertinent ROS negative.     Past Medical History:  Diagnosis Date  . Anxiety   . Contraceptive management 07/20/2014  . Menstrual cramps 07/20/2014  . PMS (premenstrual syndrome) 07/20/2014  . Skin cyst 03/10/2013   Right clavicle area very mobile   Past Surgical History:  Procedure Laterality Date  . NO PAST SURGERIES     Allergies  Allergen Reactions  . Bee Venom Anaphylaxis and Hives  . Amoxicillin-Pot Clavulanate     Other reaction(s): GI Intolerance  . Sulfa Antibiotics Rash   No current facility-administered medications on file prior to encounter.   Current Outpatient Medications on File Prior to Encounter  Medication Sig Dispense Refill  . loratadine (CLARITIN) 10 MG tablet Take 10 mg by mouth daily.    . norethindrone-ethinyl estradiol (JUNEL FE 1/20) 1-20 MG-MCG tablet Take 1 tablet by mouth daily. 84 tablet 3  . sertraline (ZOLOFT) 50 MG tablet Take 1 tablet by mouth once daily 90 tablet 3   Social History   Socioeconomic History  . Marital status: Married    Spouse name: Not on file  . Number of children: Not on file  . Years of education: Not on file  . Highest education level: Not on file  Occupational History  . Not on file  Tobacco Use  . Smoking status: Never Smoker  . Smokeless tobacco: Never Used  Vaping Use  . Vaping Use: Never used  Substance and Sexual Activity  . Alcohol  use: No  . Drug use: No  . Sexual activity: Yes    Birth control/protection: Pill  Other Topics Concern  . Not on file  Social History Narrative  . Not on file   Social Determinants of Health   Financial Resource Strain:   . Difficulty of Paying Living Expenses:   Food Insecurity:   . Worried About Charity fundraiser in the Last Year:   . Arboriculturist in the Last Year:   Transportation Needs:   . Film/video editor (Medical):   Marland Kitchen Lack of Transportation (Non-Medical):   Physical Activity:   . Days of Exercise per Week:   . Minutes of Exercise per Session:   Stress:   . Feeling of Stress :   Social Connections:   . Frequency of Communication with Friends and Family:   . Frequency of Social Gatherings with Friends and Family:   . Attends Religious Services:   . Active Member of Clubs or Organizations:   . Attends Archivist Meetings:   Marland Kitchen Marital Status:   Intimate Partner Violence:   . Fear of Current or Ex-Partner:   . Emotionally Abused:   Marland Kitchen Physically Abused:   . Sexually Abused:    Family History  Problem Relation Age of Onset  . Heart disease Maternal Grandmother   . Heart disease Maternal Grandfather   .  Cancer Paternal Grandmother        colon  . Fibromyalgia Mother     OBJECTIVE: Vitals:   11/27/19 0821  BP: (!) 139/96  Resp: 16  Temp: 98.3 F (36.8 C)  TempSrc: Oral  SpO2: 97%  Weight: 165 lb (74.8 kg)    General appearance: alert; no distress Head: NCAT Lungs: normal respiratory effort Extremities: no edema Skin: warm and dry; areas of linear maculopapular rash with surrounding erythema to RT axilla, and posterior LT knee, NTTP, no obvious drainage or bleeding Psychological: alert and cooperative; normal mood and affect  ASSESSMENT & PLAN:  1. Allergic contact dermatitis due to plants, except food   2. Poison ivy dermatitis     Meds ordered this encounter  Medications  . predniSONE (DELTASONE) 20 MG tablet    Sig: Take  1 tablet (20 mg total) by mouth 2 (two) times daily with a meal for 5 days.    Dispense:  10 tablet    Refill:  0    Order Specific Question:   Supervising Provider    Answer:   Raylene Everts [[id number]]  . triamcinolone cream (KENALOG) 0.1 %    Sig: Apply 1 application topically 2 (two) times daily.    Dispense:  60 g    Refill:  0    Order Specific Question:   Supervising Provider    Answer:   Raylene Everts [[id number]]  . dexamethasone (DECADRON) injection 10 mg    Wash with warm water and mild soap Steroid shot given in office Prednisone prescribed.  Take as directed and to completion Triamcinolone cream prescribed.  Use as directed Use OTC zyrtec, allegra, or claritin during the day.  Benadryl at night. You may also use OTC hydrocortisone cream and/or calamine lotion to help alleviate itching Follow up with PCP if symptoms persists  Return or go to the ED if you have any new or worsening symptoms such as fever, chills, nausea, vomiting, difficulty breathing, throat swelling, tongue swelling, numbness/ tingling in mouth, worsening symptoms despite treatment, etc...   Reviewed expectations re: course of current medical issues. Questions answered. Outlined signs and symptoms indicating need for more acute intervention. Patient verbalized understanding. After Visit Summary given.   Stacey Drain [place], PA-C 11/27/19 [phone]

## 2019-12-07 ENCOUNTER — Encounter: Payer: 59 | Admitting: Podiatry

## 2019-12-25 ENCOUNTER — Encounter: Payer: Self-pay | Admitting: Emergency Medicine

## 2019-12-25 ENCOUNTER — Other Ambulatory Visit: Payer: Self-pay

## 2019-12-25 ENCOUNTER — Ambulatory Visit
Admission: EM | Admit: 2019-12-25 | Discharge: 2019-12-25 | Disposition: A | Discharge status: Home / Self Care | Payer: 59 | Attending: Emergency Medicine | Admitting: Emergency Medicine

## 2019-12-25 DIAGNOSIS — L237 Allergic contact dermatitis due to plants, except food: Secondary | ICD-10-CM

## 2019-12-25 DIAGNOSIS — L299 Pruritus, unspecified: Secondary | ICD-10-CM

## 2019-12-25 MED ORDER — DEXAMETHASONE SODIUM PHOSPHATE 10 MG/ML IJ SOLN
10.0000 mg | Freq: Once | INTRAMUSCULAR | Status: AC
  Administered 2019-12-25: 10 mg via INTRAMUSCULAR

## 2019-12-25 NOTE — Discharge Instructions (Signed)
Wash with warm water and mild soap Steroid shot given in office Use OTC zyrtec, allegra, or claritin during the day.  Benadryl at night. You may also use OTC hydrocortisone cream and/or calamine lotion to help alleviate itching Follow up with PCP if symptoms persists  Return or go to the ED if you have any new or worsening symptoms such as fever, chills, nausea, vomiting, difficulty breathing, throat swelling, tongue swelling, numbness/ tingling in mouth, worsening symptoms despite treatment, etc...  

## 2019-12-25 NOTE — ED Triage Notes (Signed)
Rash to LT arm started yesterday. Pt states she needs a steroid shot

## 2019-12-25 NOTE — ED Provider Notes (Signed)
[place]   [id number] 12/25/19 Arrival Time: 4098  CC: Rash  SUBJECTIVE:  Ann Campbell is a 44 y.o. female who presents with poison ivy rash x 1 day.  Symptoms began after getting to poison ivy.  Localizes the rash to LT arm.  Describes it as itching and spreading.  Denies improvement with OTC medications.  Denies aggravating factors. Reports similar symptoms in the past that improved with steroid shot.   Denies fever, chills, nausea, vomiting, discharge, oral manifestations, SOB, chest pain, abdominal pain, changes in bowel or bladder function.    ROS: As per HPI.  All other pertinent ROS negative.     Past Medical History:  Diagnosis Date  . Anxiety   . Contraceptive management 07/20/2014  . Menstrual cramps 07/20/2014  . PMS (premenstrual syndrome) 07/20/2014  . Skin cyst 03/10/2013   Right clavicle area very mobile   Past Surgical History:  Procedure Laterality Date  . NO PAST SURGERIES     Allergies  Allergen Reactions  . Bee Venom Anaphylaxis and Hives  . Amoxicillin-Pot Clavulanate     Other reaction(s): GI Intolerance  . Sulfa Antibiotics Rash   No current facility-administered medications on file prior to encounter.   Current Outpatient Medications on File Prior to Encounter  Medication Sig Dispense Refill  . loratadine (CLARITIN) 10 MG tablet Take 10 mg by mouth daily.    . norethindrone-ethinyl estradiol (JUNEL FE 1/20) 1-20 MG-MCG tablet Take 1 tablet by mouth daily. 84 tablet 3  . sertraline (ZOLOFT) 50 MG tablet Take 1 tablet by mouth once daily 90 tablet 3  . triamcinolone cream (KENALOG) 0.1 % Apply 1 application topically 2 (two) times daily. 60 g 0   Social History   Socioeconomic History  . Marital status: Married    Spouse name: Not on file  . Number of children: Not on file  . Years of education: Not on file  . Highest education level: Not on file  Occupational History  . Not on file  Tobacco Use  . Smoking status: Never Smoker  .  Smokeless tobacco: Never Used  Vaping Use  . Vaping Use: Never used  Substance and Sexual Activity  . Alcohol use: No  . Drug use: No  . Sexual activity: Yes    Birth control/protection: Pill  Other Topics Concern  . Not on file  Social History Narrative  . Not on file   Social Determinants of Health   Financial Resource Strain:   . Difficulty of Paying Living Expenses:   Food Insecurity:   . Worried About Charity fundraiser in the Last Year:   . Arboriculturist in the Last Year:   Transportation Needs:   . Film/video editor (Medical):   Marland Kitchen Lack of Transportation (Non-Medical):   Physical Activity:   . Days of Exercise per Week:   . Minutes of Exercise per Session:   Stress:   . Feeling of Stress :   Social Connections:   . Frequency of Communication with Friends and Family:   . Frequency of Social Gatherings with Friends and Family:   . Attends Religious Services:   . Active Member of Clubs or Organizations:   . Attends Archivist Meetings:   Marland Kitchen Marital Status:   Intimate Partner Violence:   . Fear of Current or Ex-Partner:   . Emotionally Abused:   Marland Kitchen Physically Abused:   . Sexually Abused:    Family History  Problem Relation Age of  Onset  . Heart disease Maternal Grandmother   . Heart disease Maternal Grandfather   . Cancer Paternal Grandmother        colon  . Fibromyalgia Mother     OBJECTIVE: Vitals:   12/25/19 0823 12/25/19 0824  BP: (!) 133/93   Pulse: 63   Resp: 18   Temp: 98.3 F (36.8 C)   TempSrc: Oral   SpO2: 98%   Weight:  163 lb 2.3 oz (74 kg)  Height:  5\' 4"  (1.626 m)    General appearance: alert; no distress Head: NCAT Lungs: normal respiratory effort Extremities: no edema Skin: warm and dry; areas of linear papules and vesicles with surrounding erythema LT lower arm, NTTP, no obvious drainage or bleeding Psychological: alert and cooperative; normal mood and affect  ASSESSMENT & PLAN:  1. Poison ivy dermatitis   2.  Itching     Meds ordered this encounter  Medications  . dexamethasone (DECADRON) injection 10 mg    Wash with warm water and mild soap Steroid shot given in office Use OTC zyrtec, allegra, or claritin during the day.  Benadryl at night. You may also use OTC hydrocortisone cream and/or calamine lotion to help alleviate itching Follow up with PCP if symptoms persists  Return or go to the ED if you have any new or worsening symptoms such as fever, chills, nausea, vomiting, difficulty breathing, throat swelling, tongue swelling, numbness/ tingling in mouth, worsening symptoms despite treatment, etc...   Reviewed expectations re: course of current medical issues. Questions answered. Outlined signs and symptoms indicating need for more acute intervention. Patient verbalized understanding. After Visit Summary given.   Stacey Drain [place], PA-C 12/25/19 [phone]

## 2020-01-27 ENCOUNTER — Ambulatory Visit (INDEPENDENT_AMBULATORY_CARE_PROVIDER_SITE_OTHER): Payer: 59 | Admitting: Adult Health

## 2020-01-27 ENCOUNTER — Other Ambulatory Visit (HOSPITAL_COMMUNITY)
Admission: RE | Admit: 2020-01-27 | Discharge: 2020-01-27 | Disposition: A | Discharge status: Home / Self Care | Payer: 59 | Source: Ambulatory Visit | Attending: Adult Health | Admitting: Adult Health

## 2020-01-27 ENCOUNTER — Encounter: Payer: Self-pay | Admitting: Adult Health

## 2020-01-27 VITALS — BP 138/95 | HR 86 | Ht 65.0 in | Wt 174.0 lb

## 2020-01-27 DIAGNOSIS — Z3041 Encounter for surveillance of contraceptive pills: Secondary | ICD-10-CM

## 2020-01-27 DIAGNOSIS — Z1211 Encounter for screening for malignant neoplasm of colon: Secondary | ICD-10-CM | POA: Diagnosis not present

## 2020-01-27 DIAGNOSIS — Z01419 Encounter for gynecological examination (general) (routine) without abnormal findings: Secondary | ICD-10-CM | POA: Diagnosis not present

## 2020-01-27 DIAGNOSIS — N943 Premenstrual tension syndrome: Secondary | ICD-10-CM | POA: Diagnosis not present

## 2020-01-27 DIAGNOSIS — R030 Elevated blood-pressure reading, without diagnosis of hypertension: Secondary | ICD-10-CM

## 2020-01-27 LAB — HEMOCCULT GUIAC POC 1CARD (OFFICE): Fecal Occult Blood, POC: NEGATIVE

## 2020-01-27 MED ORDER — SERTRALINE HCL 50 MG PO TABS: 50.0000 mg | ORAL_TABLET | Freq: Every day | ORAL | 3 refills | Status: DC

## 2020-01-27 NOTE — Patient Instructions (Signed)
DASH Eating Plan DASH stands for "Dietary Approaches to Stop Hypertension." The DASH eating plan is a healthy eating plan that has been shown to reduce high blood pressure (hypertension). It may also reduce your risk for type 2 diabetes, heart disease, and stroke. The DASH eating plan may also help with weight loss. What are tips for following this plan?  General guidelines  Avoid eating more than 2,300 mg (milligrams) of salt (sodium) a day. If you have hypertension, you may need to reduce your sodium intake to 1,500 mg a day.  Limit alcohol intake to no more than 1 drink a day for nonpregnant women and 2 drinks a day for men. One drink equals 12 oz of beer, 5 oz of wine, or 1 oz of hard liquor.  Work with your health care provider to maintain a healthy body weight or to lose weight. Ask what an ideal weight is for you.  Get at least 30 minutes of exercise that causes your heart to beat faster (aerobic exercise) most days of the week. Activities may include walking, swimming, or biking.  Work with your health care provider or diet and nutrition specialist (dietitian) to adjust your eating plan to your individual calorie needs. Reading food labels   Check food labels for the amount of sodium per serving. Choose foods with less than 5 percent of the Daily Value of sodium. Generally, foods with less than 300 mg of sodium per serving fit into this eating plan.  To find whole grains, look for the word "whole" as the first word in the ingredient list. Shopping  Buy products labeled as "low-sodium" or "no salt added."  Buy fresh foods. Avoid canned foods and premade or frozen meals. Cooking  Avoid adding salt when cooking. Use salt-free seasonings or herbs instead of table salt or sea salt. Check with your health care provider or pharmacist before using salt substitutes.  Do not fry foods. Cook foods using healthy methods such as baking, boiling, grilling, and broiling instead.  Cook with  heart-healthy oils, such as olive, canola, soybean, or sunflower oil. Meal planning  Eat a balanced diet that includes: ? 5 or more servings of fruits and vegetables each day. At each meal, try to fill half of your plate with fruits and vegetables. ? Up to 6-8 servings of whole grains each day. ? Less than 6 oz of lean meat, poultry, or fish each day. A 3-oz serving of meat is about the same size as a deck of cards. One egg equals 1 oz. ? 2 servings of low-fat dairy each day. ? A serving of nuts, seeds, or beans 5 times each week. ? Heart-healthy fats. Healthy fats called Omega-3 fatty acids are found in foods such as flaxseeds and coldwater fish, like sardines, salmon, and mackerel.  Limit how much you eat of the following: ? Canned or prepackaged foods. ? Food that is high in trans fat, such as fried foods. ? Food that is high in saturated fat, such as fatty meat. ? Sweets, desserts, sugary drinks, and other foods with added sugar. ? Full-fat dairy products.  Do not salt foods before eating.  Try to eat at least 2 vegetarian meals each week.  Eat more home-cooked food and less restaurant, buffet, and fast food.  When eating at a restaurant, ask that your food be prepared with less salt or no salt, if possible. What foods are recommended? The items listed may not be a complete list. Talk with your dietitian about   what dietary choices are best for you. Grains Whole-grain or whole-wheat bread. Whole-grain or whole-wheat pasta. Brown rice. Oatmeal. Quinoa. Bulgur. Whole-grain and low-sodium cereals. Pita bread. Low-fat, low-sodium crackers. Whole-wheat flour tortillas. Vegetables Fresh or frozen vegetables (raw, steamed, roasted, or grilled). Low-sodium or reduced-sodium tomato and vegetable juice. Low-sodium or reduced-sodium tomato sauce and tomato paste. Low-sodium or reduced-sodium canned vegetables. Fruits All fresh, dried, or frozen fruit. Canned fruit in natural juice (without  added sugar). Meat and other protein foods Skinless chicken or turkey. Ground chicken or turkey. Pork with fat trimmed off. Fish and seafood. Egg whites. Dried beans, peas, or lentils. Unsalted nuts, nut butters, and seeds. Unsalted canned beans. Lean cuts of beef with fat trimmed off. Low-sodium, lean deli meat. Dairy Low-fat (1%) or fat-free (skim) milk. Fat-free, low-fat, or reduced-fat cheeses. Nonfat, low-sodium ricotta or cottage cheese. Low-fat or nonfat yogurt. Low-fat, low-sodium cheese. Fats and oils Soft margarine without trans fats. Vegetable oil. Low-fat, reduced-fat, or light mayonnaise and salad dressings (reduced-sodium). Canola, safflower, olive, soybean, and sunflower oils. Avocado. Seasoning and other foods Herbs. Spices. Seasoning mixes without salt. Unsalted popcorn and pretzels. Fat-free sweets. What foods are not recommended? The items listed may not be a complete list. Talk with your dietitian about what dietary choices are best for you. Grains Baked goods made with fat, such as croissants, muffins, or some breads. Dry pasta or rice meal packs. Vegetables Creamed or fried vegetables. Vegetables in a cheese sauce. Regular canned vegetables (not low-sodium or reduced-sodium). Regular canned tomato sauce and paste (not low-sodium or reduced-sodium). Regular tomato and vegetable juice (not low-sodium or reduced-sodium). Pickles. Olives. Fruits Canned fruit in a light or heavy syrup. Fried fruit. Fruit in cream or butter sauce. Meat and other protein foods Fatty cuts of meat. Ribs. Fried meat. Bacon. Sausage. Bologna and other processed lunch meats. Salami. Fatback. Hotdogs. Bratwurst. Salted nuts and seeds. Canned beans with added salt. Canned or smoked fish. Whole eggs or egg yolks. Chicken or turkey with skin. Dairy Whole or 2% milk, cream, and half-and-half. Whole or full-fat cream cheese. Whole-fat or sweetened yogurt. Full-fat cheese. Nondairy creamers. Whipped toppings.  Processed cheese and cheese spreads. Fats and oils Butter. Stick margarine. Lard. Shortening. Ghee. Bacon fat. Tropical oils, such as coconut, palm kernel, or palm oil. Seasoning and other foods Salted popcorn and pretzels. Onion salt, garlic salt, seasoned salt, table salt, and sea salt. Worcestershire sauce. Tartar sauce. Barbecue sauce. Teriyaki sauce. Soy sauce, including reduced-sodium. Steak sauce. Canned and packaged gravies. Fish sauce. Oyster sauce. Cocktail sauce. Horseradish that you find on the shelf. Ketchup. Mustard. Meat flavorings and tenderizers. Bouillon cubes. Hot sauce and Tabasco sauce. Premade or packaged marinades. Premade or packaged taco seasonings. Relishes. Regular salad dressings. Where to find more information:  National Heart, Lung, and Blood Institute: www.nhlbi.nih.gov  American Heart Association: www.heart.org Summary  The DASH eating plan is a healthy eating plan that has been shown to reduce high blood pressure (hypertension). It may also reduce your risk for type 2 diabetes, heart disease, and stroke.  With the DASH eating plan, you should limit salt (sodium) intake to 2,300 mg a day. If you have hypertension, you may need to reduce your sodium intake to 1,500 mg a day.  When on the DASH eating plan, aim to eat more fresh fruits and vegetables, whole grains, lean proteins, low-fat dairy, and heart-healthy fats.  Work with your health care provider or diet and nutrition specialist (dietitian) to adjust your eating plan to your   individual calorie needs. This information is not intended to replace advice given to you by your health care provider. Make sure you discuss any questions you have with your health care provider. Document Revised: 04/18/2017 Document Reviewed: 04/29/2016 Elsevier Patient Education  2020 Elsevier Inc.  

## 2020-01-27 NOTE — Progress Notes (Signed)
Patient ID: Ann Campbell, female   DOB: Aug 19, 1975, 44 y.o.   MRN: [id number] History of Present Illness: Ann Campbell is a 44 year old white female, married, [id number] in for a well woman gyn exam and pap. She is active, works out 5 days a week and eats healthy.  PCP is Dr Willey Blade.   Current Medications, Allergies, Past Medical History, Past Surgical History, Family History and Social History were reviewed in [employer] record.     Review of Systems: Patient denies any headaches, hearing loss, fatigue, blurred vision, shortness of breath, chest pain, abdominal pain, problems with bowel movements, urination, or intercourse. No joint pain or mood swings. Has occasional night sweats  Takes COC, had lite 3-4 day period in August She stopped OCs and was trying estroven  Has irritability     Physical Exam:BP (!) 138/95 (BP Location: Right Arm, Patient Position: Sitting, Cuff Size: Normal)   Pulse 86   Ht 5\' 5"  (1.651 m)   Wt 174 lb (78.9 kg)   LMP 01/01/2020 (Exact Date)   BMI 28.96 kg/m  General:  Well developed, well nourished, no acute distress Skin:  Warm and dry Neck:  Midline trachea, normal thyroid, good ROM, no lymphadenopathy Lungs; Clear to auscultation bilaterally Breast:  No dominant palpable mass, retraction, or nipple discharge Cardiovascular: Regular rate and rhythm Abdomen:  Soft, non tender, no hepatosplenomegaly Pelvic:  External genitalia is normal in appearance, no lesions.  The vagina is normal in appearance. Urethra has no lesions or masses. The cervix is bulbous.Pap with high risk HPV genotyping performed.  Uterus is felt to be normal size, shape, and contour.  No adnexal masses or tenderness noted.Bladder is non tender, no masses felt. Rectal: Good sphincter tone, no polyps, or hemorrhoids felt.  Hemoccult negative. Extremities/musculoskeletal:  No swelling or varicosities noted, no clubbing or cyanosis Psych:  No mood changes, alert and  cooperative,seems happy AA is 4 Fall risk is low PHQ 9 score is 0   [facility] - 01/27/20 1347      Pregnancy Intention Screening   Does the patient want to become pregnant in the next year? No    Does the patient's partner want to become pregnant in the next year? No    Would the patient like to discuss contraceptive options today? No      Contraception Wrap Up   Current Method Oral Contraceptive    End Method Oral Contraceptive    Contraception Counseling Provided No         Examination chaperoned by Dwyane Dee LPN  Impression and Plan: 1. Encounter for gynecological examination with Papanicolaou smear of cervix Pap sent Physical in 1 year Pap in 3 years if normal Mammogram yearly Colonoscopy at 45 Check CBC,CMP,TSH and lipids,orders given get fasting  2. Encounter for surveillance of contraceptive pills Stop estroven and restart OCs, has refills    3. PMS (premenstrual syndrome) Continue zoloft Meds ordered this encounter  Medications  . sertraline (ZOLOFT) 50 MG tablet    Sig: Take 1 tablet (50 mg total) by mouth daily.    Dispense:  90 tablet    Refill:  3    Order Specific Question:   Supervising Provider    Answer:   Elonda Husky, LUTHER H [[id number]]     4. Encounter for screening fecal occult blood testing  5. Elevated BP Recheck in 6 weeks Review DASH diet

## 2020-01-31 LAB — CYTOLOGY - PAP
Adequacy: ABSENT
Comment: NEGATIVE
Diagnosis: NEGATIVE
High risk HPV: NEGATIVE

## 2020-03-08 ENCOUNTER — Ambulatory Visit: Payer: 59 | Admitting: Adult Health

## 2020-03-20 ENCOUNTER — Ambulatory Visit: Payer: 59 | Admitting: Adult Health

## 2020-03-21 ENCOUNTER — Ambulatory Visit: Payer: 59 | Admitting: Podiatry

## 2020-03-21 IMAGING — MG DIGITAL SCREENING BILATERAL MAMMOGRAM WITH TOMO AND CAD
8 series · 8 of 24 positions shown · non-contrast
Comparison: Previous exam(s).

CLINICAL DATA: Screening.

EXAM:
DIGITAL SCREENING BILATERAL MAMMOGRAM WITH TOMO AND CAD

[L CC synth-2D]
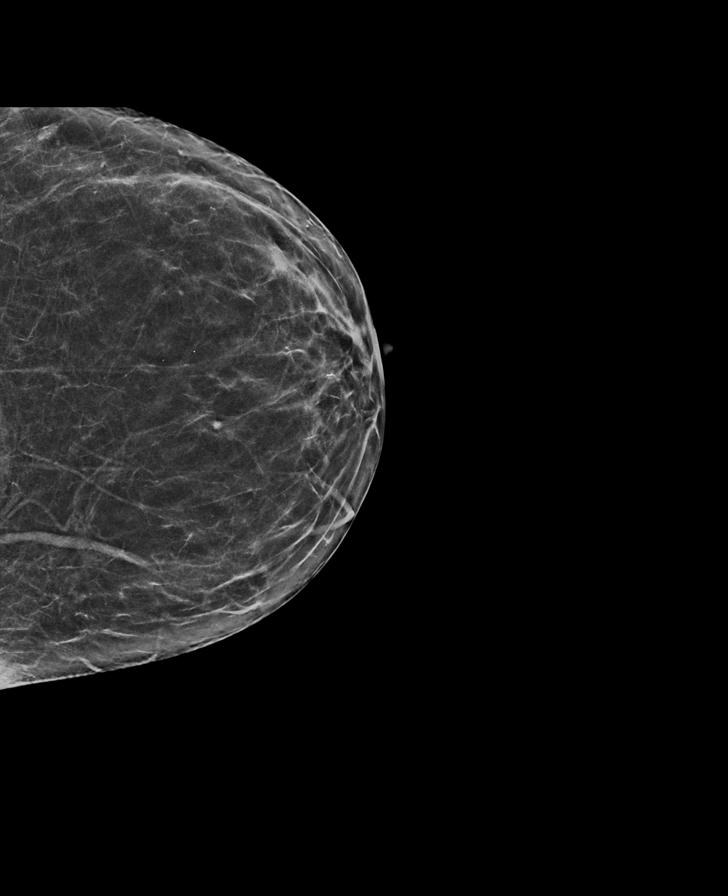

[R MLO synth-2D]
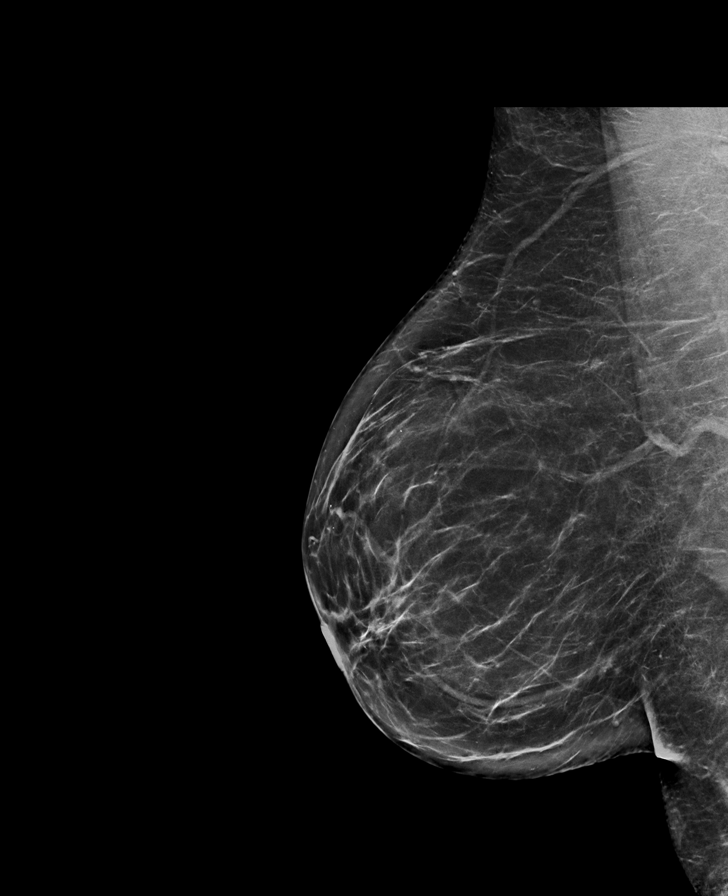

[L MLO synth-2D]
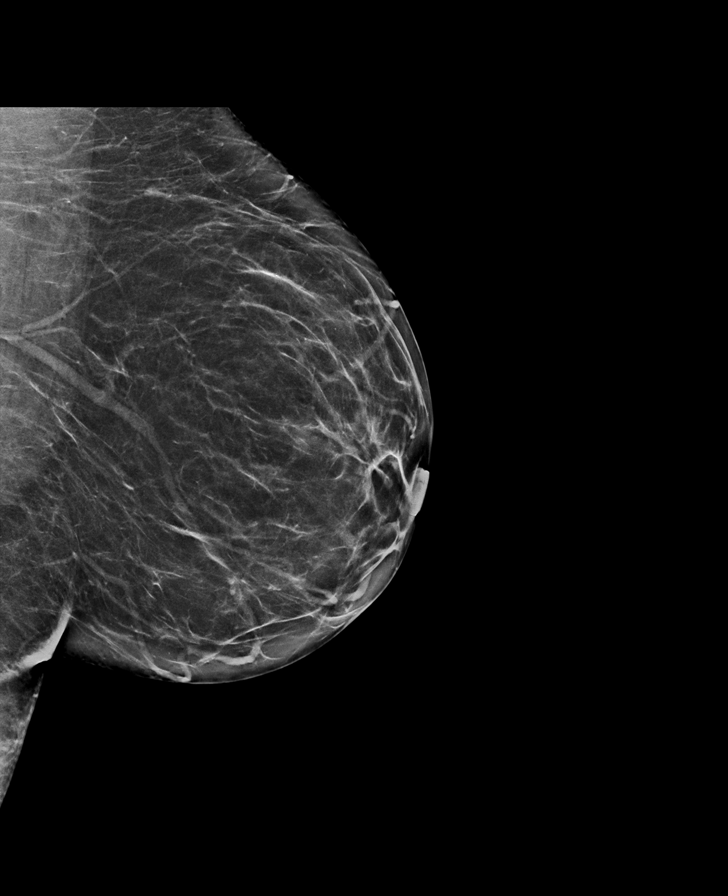

[R CC synth-2D]
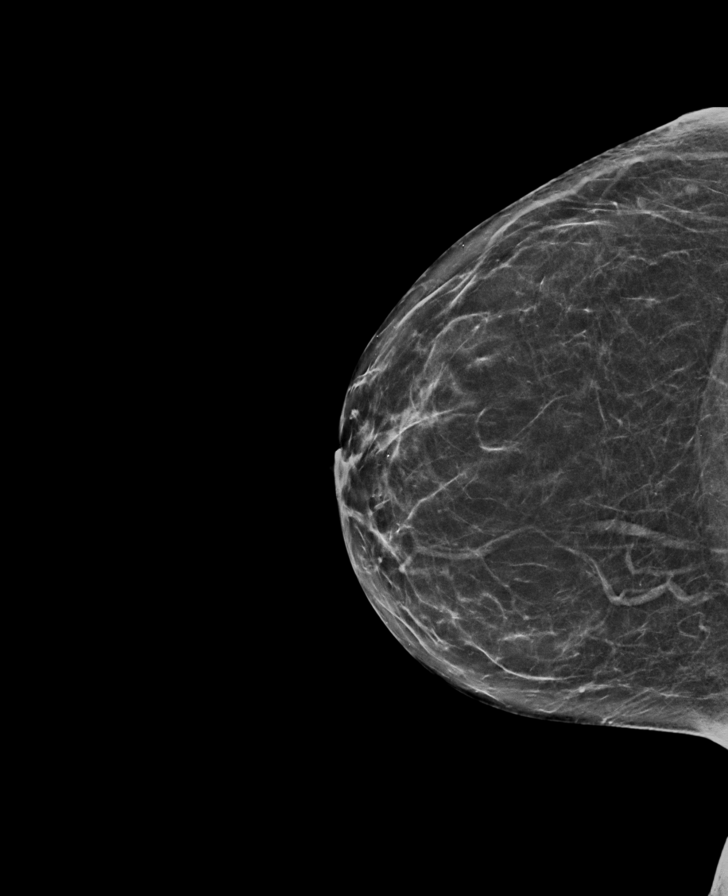

[R CC tomo · tomo slice 31/62.0]
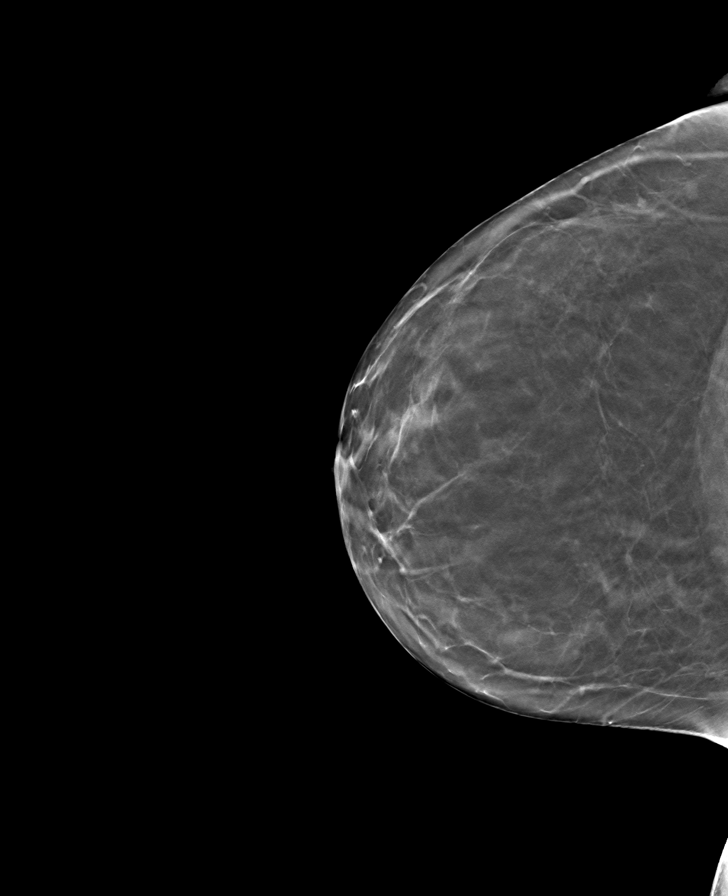

[R MLO tomo · tomo slice 37/74.0]
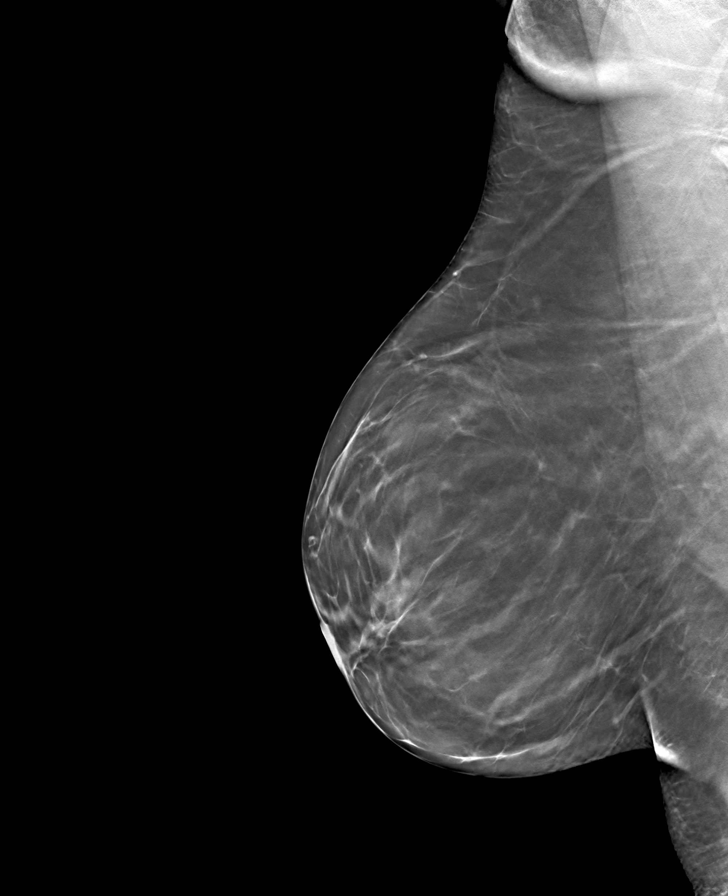

[L CC tomo · tomo slice 31/62.0]
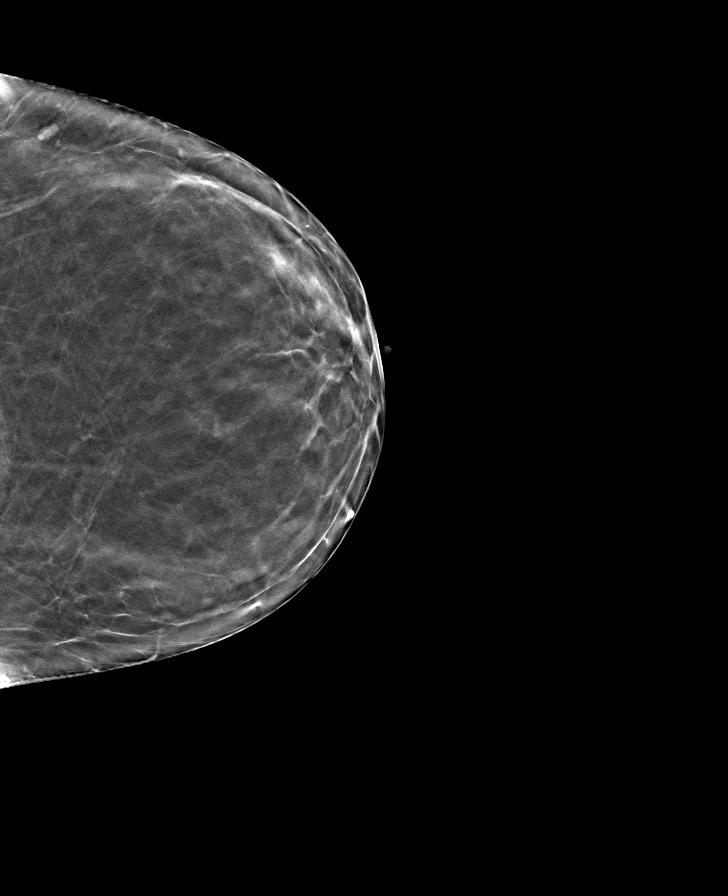

[L MLO tomo · tomo slice 34/67.0]
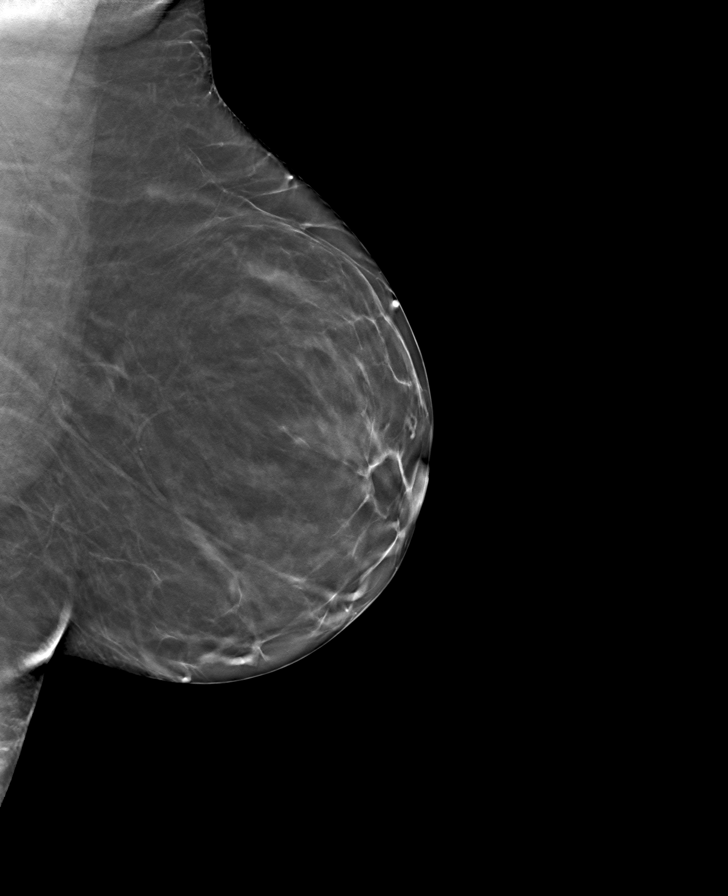

[8 of 24 positions shown; findings below may reference images not displayed]

ACR Breast Density Category b: There are scattered areas of
fibroglandular density.
FINDINGS: There are no findings suspicious for malignancy. Images were
processed with CAD.
IMPRESSION: No mammographic evidence of malignancy. A result letter of this
screening mammogram will be mailed directly to the patient.

RECOMMENDATION:
Screening mammogram in one year. (Code:[id number])

BI-RADS CATEGORY  1: Negative.

## 2020-03-29 ENCOUNTER — Other Ambulatory Visit (HOSPITAL_COMMUNITY): Payer: Self-pay | Admitting: Adult Health

## 2020-03-29 DIAGNOSIS — Z1231 Encounter for screening mammogram for malignant neoplasm of breast: Secondary | ICD-10-CM

## 2020-03-31 LAB — COMPREHENSIVE METABOLIC PANEL
ALT: 17 IU/L (ref 0–32)
AST: 21 IU/L (ref 0–40)
Albumin/Globulin Ratio: 2.2 (ref 1.2–2.2)
Albumin: 4.3 g/dL (ref 3.8–4.8)
Alkaline Phosphatase: 42 IU/L — ABNORMAL LOW (ref 44–121)
BUN/Creatinine Ratio: 17 (ref 9–23)
BUN: 14 mg/dL (ref 6–24)
Bilirubin Total: 0.3 mg/dL (ref 0.0–1.2)
CO2: 22 mmol/L (ref 20–29)
Calcium: 9.2 mg/dL (ref 8.7–10.2)
Chloride: 106 mmol/L (ref 96–106)
Creatinine, Ser: 0.82 mg/dL (ref 0.57–1.00)
GFR calc Af Amer: 101 mL/min/{1.73_m2} (ref >59)
GFR calc non Af Amer: 87 mL/min/{1.73_m2} (ref >59)
Globulin, Total: 2 g/dL (ref 1.5–4.5)
Glucose: 85 mg/dL (ref 65–99)
Potassium: 4.5 mmol/L (ref 3.5–5.2)
Sodium: 142 mmol/L (ref 134–144)
Total Protein: 6.3 g/dL (ref 6.0–8.5)

## 2020-03-31 LAB — CBC
Hematocrit: 39.9 % (ref 34.0–46.6)
Hemoglobin: 13.6 g/dL (ref 11.1–15.9)
MCH: 31.3 pg (ref 26.6–33.0)
MCHC: 34.1 g/dL (ref 31.5–35.7)
MCV: 92 fL (ref 79–97)
Platelets: 271 10*3/uL (ref 150–450)
RBC: 4.35 x10E6/uL (ref 3.77–5.28)
RDW: 12.2 % (ref 11.7–15.4)
WBC: 5.8 10*3/uL (ref 3.4–10.8)

## 2020-03-31 LAB — LIPID PANEL
Chol/HDL Ratio: 3 ratio (ref 0.0–4.4)
Cholesterol, Total: 211 mg/dL — ABNORMAL HIGH (ref 100–199)
HDL: 70 mg/dL (ref >39)
LDL Chol Calc (NIH): 122 mg/dL — ABNORMAL HIGH (ref 0–99)
Triglycerides: 106 mg/dL (ref 0–149)
VLDL Cholesterol Cal: 19 mg/dL (ref 5–40)

## 2020-03-31 LAB — TSH: TSH: 1.57 u[IU]/mL (ref 0.450–4.500)

## 2020-04-03 ENCOUNTER — Encounter: Payer: Self-pay | Admitting: Adult Health

## 2020-04-03 ENCOUNTER — Ambulatory Visit (INDEPENDENT_AMBULATORY_CARE_PROVIDER_SITE_OTHER): Payer: 59 | Admitting: Adult Health

## 2020-04-03 ENCOUNTER — Other Ambulatory Visit: Payer: Self-pay

## 2020-04-03 VITALS — BP 144/94 | HR 93 | Ht 65.0 in | Wt 174.0 lb

## 2020-04-03 DIAGNOSIS — Z3041 Encounter for surveillance of contraceptive pills: Secondary | ICD-10-CM | POA: Diagnosis not present

## 2020-04-03 DIAGNOSIS — I10 Essential (primary) hypertension: Secondary | ICD-10-CM | POA: Diagnosis not present

## 2020-04-03 MED ORDER — LO LOESTRIN FE 1 MG-10 MCG / 10 MCG PO TABS: 1.0000 | ORAL_TABLET | Freq: Every day | ORAL | 0 refills | Status: DC

## 2020-04-03 MED ORDER — LISINOPRIL 10 MG PO TABS: 10.0000 mg | ORAL_TABLET | Freq: Every day | ORAL | 3 refills | Status: DC

## 2020-04-03 NOTE — Patient Instructions (Addendum)
Hypertension, Adult High blood pressure (hypertension) is when the force of blood pumping through the arteries is too strong. The arteries are the blood vessels that carry blood from the heart throughout the body. Hypertension forces the heart to work harder to pump blood and may cause arteries to become narrow or stiff. Untreated or uncontrolled hypertension can cause a heart attack, heart failure, a stroke, kidney disease, and other problems. A blood pressure reading consists of a higher number over a lower number. Ideally, your blood pressure should be below 120/80. The first ("top") number is called the systolic pressure. It is a measure of the pressure in your arteries as your heart beats. The second ("bottom") number is called the diastolic pressure. It is a measure of the pressure in your arteries as the heart relaxes. What are the causes? The exact cause of this condition is not known. There are some conditions that result in or are related to high blood pressure. What increases the risk? Some risk factors for high blood pressure are under your control. The following factors may make you more likely to develop this condition:  Smoking.  Having type 2 diabetes mellitus, high cholesterol, or both.  Not getting enough exercise or physical activity.  Being overweight.  Having too much fat, sugar, calories, or salt (sodium) in your diet.  Drinking too much alcohol. Some risk factors for high blood pressure may be difficult or impossible to change. Some of these factors include:  Having chronic kidney disease.  Having a family history of high blood pressure.  Age. Risk increases with age.  Race. You may be at higher risk if you are African American.  Gender. Men are at higher risk than women before age 45. After age 65, women are at higher risk than men.  Having obstructive sleep apnea.  Stress. What are the signs or symptoms? High blood pressure may not cause symptoms. Very high  blood pressure (hypertensive crisis) may cause:  Headache.  Anxiety.  Shortness of breath.  Nosebleed.  Nausea and vomiting.  Vision changes.  Severe chest pain.  Seizures. How is this diagnosed? This condition is diagnosed by measuring your blood pressure while you are seated, with your arm resting on a flat surface, your legs uncrossed, and your feet flat on the floor. The cuff of the blood pressure monitor will be placed directly against the skin of your upper arm at the level of your heart. It should be measured at least twice using the same arm. Certain conditions can cause a difference in blood pressure between your right and left arms. Certain factors can cause blood pressure readings to be lower or higher than normal for a short period of time:  When your blood pressure is higher when you are in a health care provider's office than when you are at home, this is called white coat hypertension. Most people with this condition do not need medicines.  When your blood pressure is higher at home than when you are in a health care provider's office, this is called masked hypertension. Most people with this condition may need medicines to control blood pressure. If you have a high blood pressure reading during one visit or you have normal blood pressure with other risk factors, you may be asked to:  Return on a different day to have your blood pressure checked again.  Monitor your blood pressure at home for 1 week or longer. If you are diagnosed with hypertension, you may have other blood or   imaging tests to help your health care provider understand your overall risk for other conditions. How is this treated? This condition is treated by making healthy lifestyle changes, such as eating healthy foods, exercising more, and reducing your alcohol intake. Your health care provider may prescribe medicine if lifestyle changes are not enough to get your blood pressure under control, and  if:  Your systolic blood pressure is above 130.  Your diastolic blood pressure is above 80. Your personal target blood pressure may vary depending on your medical conditions, your age, and other factors. Follow these instructions at home: Eating and drinking   Eat a diet that is high in fiber and potassium, and low in sodium, added sugar, and fat. An example eating plan is called the DASH (Dietary Approaches to Stop Hypertension) diet. To eat this way: ? Eat plenty of fresh fruits and vegetables. Try to fill one half of your plate at each meal with fruits and vegetables. ? Eat whole grains, such as whole-wheat pasta, brown rice, or whole-grain bread. Fill about one fourth of your plate with whole grains. ? Eat or drink low-fat dairy products, such as skim milk or low-fat yogurt. ? Avoid fatty cuts of meat, processed or cured meats, and poultry with skin. Fill about one fourth of your plate with lean proteins, such as fish, chicken without skin, beans, eggs, or tofu. ? Avoid pre-made and processed foods. These tend to be higher in sodium, added sugar, and fat.  Reduce your daily sodium intake. Most people with hypertension should eat less than 1,500 mg of sodium a day.  Do not drink alcohol if: ? Your health care provider tells you not to drink. ? You are pregnant, may be pregnant, or are planning to become pregnant.  If you drink alcohol: ? Limit how much you use to:  0-1 drink a day for women.  0-2 drinks a day for men. ? Be aware of how much alcohol is in your drink. In the [place] one drink equals one 12 oz bottle of beer (355 mL), one 5 oz glass of wine (148 mL), or one 1 oz glass of hard liquor (44 mL). Lifestyle   Work with your health care provider to maintain a healthy body weight or to lose weight. Ask what an ideal weight is for you.  Get at least 30 minutes of exercise most days of the week. Activities may include walking, swimming, or biking.  Include exercise to  strengthen your muscles (resistance exercise), such as Pilates or lifting weights, as part of your weekly exercise routine. Try to do these types of exercises for 30 minutes at least 3 days a week.  Do not use any products that contain nicotine or tobacco, such as cigarettes, e-cigarettes, and chewing tobacco. If you need help quitting, ask your health care provider.  Monitor your blood pressure at home as told by your health care provider.  Keep all follow-up visits as told by your health care provider. This is important. Medicines  Take over-the-counter and prescription medicines only as told by your health care provider. Follow directions carefully. Blood pressure medicines must be taken as prescribed.  Do not skip doses of blood pressure medicine. Doing this puts you at risk for problems and can make the medicine less effective.  Ask your health care provider about side effects or reactions to medicines that you should watch for. Contact a health care provider if you:  Think you are having a reaction to a medicine you   are taking.  Have headaches that keep coming back (recurring).  Feel dizzy.  Have swelling in your ankles.  Have trouble with your vision. Get help right away if you:  Develop a severe headache or confusion.  Have unusual weakness or numbness.  Feel faint.  Have severe pain in your chest or abdomen.  Vomit repeatedly.  Have trouble breathing. Summary  Hypertension is when the force of blood pumping through your arteries is too strong. If this condition is not controlled, it may put you at risk for serious complications.  Your personal target blood pressure may vary depending on your medical conditions, your age, and other factors. For most people, a normal blood pressure is less than 120/80.  Hypertension is treated with lifestyle changes, medicines, or a combination of both. Lifestyle changes include losing weight, eating a healthy, low-sodium diet,  exercising more, and limiting alcohol. This information is not intended to replace advice given to you by your health care provider. Make sure you discuss any questions you have with your health care provider. Document Revised: 01/14/2018 Document Reviewed: 01/14/2018 Elsevier Patient Education  2020 [employer] DASH Eating Plan DASH stands for "Dietary Approaches to Stop Hypertension." The DASH eating plan is a healthy eating plan that has been shown to reduce high blood pressure (hypertension). It may also reduce your risk for type 2 diabetes, heart disease, and stroke. The DASH eating plan may also help with weight loss. What are tips for following this plan?  General guidelines  Avoid eating more than 2,300 mg (milligrams) of salt (sodium) a day. If you have hypertension, you may need to reduce your sodium intake to 1,500 mg a day.  Limit alcohol intake to no more than 1 drink a day for nonpregnant women and 2 drinks a day for men. One drink equals 12 oz of beer, 5 oz of wine, or 1 oz of hard liquor.  Work with your health care provider to maintain a healthy body weight or to lose weight. Ask what an ideal weight is for you.  Get at least 30 minutes of exercise that causes your heart to beat faster (aerobic exercise) most days of the week. Activities may include walking, swimming, or biking.  Work with your health care provider or diet and nutrition specialist (dietitian) to adjust your eating plan to your individual calorie needs. Reading food labels   Check food labels for the amount of sodium per serving. Choose foods with less than 5 percent of the Daily Value of sodium. Generally, foods with less than 300 mg of sodium per serving fit into this eating plan.  To find whole grains, look for the word "whole" as the first word in the ingredient list. Shopping  Buy products labeled as "low-sodium" or "no salt added."  Buy fresh foods. Avoid canned foods and premade or frozen  meals. Cooking  Avoid adding salt when cooking. Use salt-free seasonings or herbs instead of table salt or sea salt. Check with your health care provider or pharmacist before using salt substitutes.  Do not fry foods. Cook foods using healthy methods such as baking, boiling, grilling, and broiling instead.  Cook with heart-healthy oils, such as olive, canola, soybean, or sunflower oil. Meal planning  Eat a balanced diet that includes: ? 5 or more servings of fruits and vegetables each day. At each meal, try to fill half of your plate with fruits and vegetables. ? Up to 6-8 servings of whole grains each day. ? Less than 6   oz of lean meat, poultry, or fish each day. A 3-oz serving of meat is about the same size as a deck of cards. One egg equals 1 oz. ? 2 servings of low-fat dairy each day. ? A serving of nuts, seeds, or beans 5 times each week. ? Heart-healthy fats. Healthy fats called Omega-3 fatty acids are found in foods such as flaxseeds and coldwater fish, like sardines, salmon, and mackerel.  Limit how much you eat of the following: ? Canned or prepackaged foods. ? Food that is high in trans fat, such as fried foods. ? Food that is high in saturated fat, such as fatty meat. ? Sweets, desserts, sugary drinks, and other foods with added sugar. ? Full-fat dairy products.  Do not salt foods before eating.  Try to eat at least 2 vegetarian meals each week.  Eat more home-cooked food and less restaurant, buffet, and fast food.  When eating at a restaurant, ask that your food be prepared with less salt or no salt, if possible. What foods are recommended? The items listed may not be a complete list. Talk with your dietitian about what dietary choices are best for you. Grains Whole-grain or whole-wheat bread. Whole-grain or whole-wheat pasta. Brown rice. Oatmeal. Quinoa. Bulgur. Whole-grain and low-sodium cereals. Pita bread. Low-fat, low-sodium crackers. Whole-wheat flour  tortillas. Vegetables Fresh or frozen vegetables (raw, steamed, roasted, or grilled). Low-sodium or reduced-sodium tomato and vegetable juice. Low-sodium or reduced-sodium tomato sauce and tomato paste. Low-sodium or reduced-sodium canned vegetables. Fruits All fresh, dried, or frozen fruit. Canned fruit in natural juice (without added sugar). Meat and other protein foods Skinless chicken or turkey. Ground chicken or turkey. Pork with fat trimmed off. Fish and seafood. Egg whites. Dried beans, peas, or lentils. Unsalted nuts, nut butters, and seeds. Unsalted canned beans. Lean cuts of beef with fat trimmed off. Low-sodium, lean deli meat. Dairy Low-fat (1%) or fat-free (skim) milk. Fat-free, low-fat, or reduced-fat cheeses. Nonfat, low-sodium ricotta or cottage cheese. Low-fat or nonfat yogurt. Low-fat, low-sodium cheese. Fats and oils Soft margarine without trans fats. Vegetable oil. Low-fat, reduced-fat, or light mayonnaise and salad dressings (reduced-sodium). Canola, safflower, olive, soybean, and sunflower oils. Avocado. Seasoning and other foods Herbs. Spices. Seasoning mixes without salt. Unsalted popcorn and pretzels. Fat-free sweets. What foods are not recommended? The items listed may not be a complete list. Talk with your dietitian about what dietary choices are best for you. Grains Baked goods made with fat, such as croissants, muffins, or some breads. Dry pasta or rice meal packs. Vegetables Creamed or fried vegetables. Vegetables in a cheese sauce. Regular canned vegetables (not low-sodium or reduced-sodium). Regular canned tomato sauce and paste (not low-sodium or reduced-sodium). Regular tomato and vegetable juice (not low-sodium or reduced-sodium). Pickles. Olives. Fruits Canned fruit in a light or heavy syrup. Fried fruit. Fruit in cream or butter sauce. Meat and other protein foods Fatty cuts of meat. Ribs. Fried meat. Bacon. Sausage. Bologna and other processed lunch meats.  Salami. Fatback. Hotdogs. Bratwurst. Salted nuts and seeds. Canned beans with added salt. Canned or smoked fish. Whole eggs or egg yolks. Chicken or turkey with skin. Dairy Whole or 2% milk, cream, and half-and-half. Whole or full-fat cream cheese. Whole-fat or sweetened yogurt. Full-fat cheese. Nondairy creamers. Whipped toppings. Processed cheese and cheese spreads. Fats and oils Butter. Stick margarine. Lard. Shortening. Ghee. Bacon fat. Tropical oils, such as coconut, palm kernel, or palm oil. Seasoning and other foods Salted popcorn and pretzels. Onion salt, garlic salt, seasoned   salt, table salt, and sea salt. Worcestershire sauce. Tartar sauce. Barbecue sauce. Teriyaki sauce. Soy sauce, including reduced-sodium. Steak sauce. Canned and packaged gravies. Fish sauce. Oyster sauce. Cocktail sauce. Horseradish that you find on the shelf. Ketchup. Mustard. Meat flavorings and tenderizers. Bouillon cubes. Hot sauce and Tabasco sauce. Premade or packaged marinades. Premade or packaged taco seasonings. Relishes. Regular salad dressings. Where to find more information:  National Heart, Lung, and Blood Institute: www.nhlbi.nih.gov  American Heart Association: www.heart.org Summary  The DASH eating plan is a healthy eating plan that has been shown to reduce high blood pressure (hypertension). It may also reduce your risk for type 2 diabetes, heart disease, and stroke.  With the DASH eating plan, you should limit salt (sodium) intake to 2,300 mg a day. If you have hypertension, you may need to reduce your sodium intake to 1,500 mg a day.  When on the DASH eating plan, aim to eat more fresh fruits and vegetables, whole grains, lean proteins, low-fat dairy, and heart-healthy fats.  Work with your health care provider or diet and nutrition specialist (dietitian) to adjust your eating plan to your individual calorie needs. This information is not intended to replace advice given to you by your health  care provider. Make sure you discuss any questions you have with your health care provider. Document Revised: 04/18/2017 Document Reviewed: 04/29/2016 Elsevier Patient Education  2020 Elsevier Inc.  

## 2020-04-03 NOTE — Addendum Note (Signed)
Addended by: Derrek Monaco A on: 04/03/2020 11:42 AM   Modules accepted: Orders

## 2020-04-03 NOTE — Progress Notes (Signed)
°  Subjective:     Patient ID: Ann Campbell, female   DOB: 02-13-1976, 44 y.o.   MRN: [id number]  HPI Candas is a 44 year old white female,married, [id number] in for BP check, having periods, where used to not, and OCs being switched at drug store to different generics. PCP is Dr Willey Blade.  Review of Systems Periods are irregular Reviewed past medical,surgical, social and family history. Reviewed medications and allergies.     Objective:   Physical Exam BP (!) 144/94 (BP Location: Left Arm, Patient Position: Sitting, Cuff Size: Normal)    Pulse 93    Ht 5\' 5"  (1.651 m)    Wt 174 lb (78.9 kg)    LMP 03/24/2020 (Approximate)    BMI 28.96 kg/m  Skin warm and dry. Lungs: clear to ausculation bilaterally. Cardiovascular: regular rate and rhythm.   Labs reviewed with pt during visit.   Upstream - 04/03/20 0935      Pregnancy Intention Screening   Does the patient want to become pregnant in the next year? No    Does the patient's partner want to become pregnant in the next year? No    Would the patient like to discuss contraceptive options today? No      Contraception Wrap Up   Current Method Oral Contraceptive    End Method Oral Contraceptive    Contraception Counseling Provided No          Assessment:     1. Hypertension, unspecified type Will add lisinopril  Review handouts on DASH diet and hypertension  2. Encounter for surveillance of contraceptive pills Finish current pack of Larin and start Lo Loestrin, 4 packs given Review handout on endometrial ablation     Plan:     Follow  Up in 8 weeks

## 2020-04-11 ENCOUNTER — Telehealth: Payer: Self-pay

## 2020-04-11 NOTE — Telephone Encounter (Signed)
DOS 04/21/2020  METATARSAL OSTEOTOMY 5TH RT - 28308  [facility] FROM [facility] - AUTH # [id number] GOOD FROM 04/21/2020 - 07/20/2020

## 2020-04-12 ENCOUNTER — Telehealth: Payer: Self-pay

## 2020-04-12 NOTE — Telephone Encounter (Signed)
Ann Campbell called to cancel her surgery with Dr. Milinda Pointer on 04/21/2020. She stated she wanted to wait till next year to have the surgery and she will call us back when she's ready to reschedule. Notified Caren Griffins with [facility] and Dr. Milinda Pointer

## 2020-04-27 ENCOUNTER — Encounter: Payer: 59 | Admitting: Podiatry

## 2020-04-28 ENCOUNTER — Encounter: Payer: Self-pay | Admitting: Adult Health

## 2020-05-01 ENCOUNTER — Ambulatory Visit (HOSPITAL_COMMUNITY)
Admission: RE | Admit: 2020-05-01 | Discharge: 2020-05-01 | Disposition: A | Discharge status: Home / Self Care | Payer: 59 | Source: Ambulatory Visit | Attending: Adult Health | Admitting: Adult Health

## 2020-05-01 ENCOUNTER — Other Ambulatory Visit: Payer: Self-pay

## 2020-05-01 DIAGNOSIS — Z1231 Encounter for screening mammogram for malignant neoplasm of breast: Secondary | ICD-10-CM | POA: Diagnosis not present

## 2020-05-04 ENCOUNTER — Encounter: Payer: 59 | Admitting: Podiatry

## 2020-05-18 ENCOUNTER — Other Ambulatory Visit: Payer: Self-pay

## 2020-05-18 ENCOUNTER — Encounter: Payer: 59 | Admitting: Podiatry

## 2020-05-18 ENCOUNTER — Encounter: Payer: Self-pay | Admitting: Podiatry

## 2020-05-18 ENCOUNTER — Ambulatory Visit (INDEPENDENT_AMBULATORY_CARE_PROVIDER_SITE_OTHER): Payer: 59 | Admitting: Podiatry

## 2020-05-18 ENCOUNTER — Ambulatory Visit (INDEPENDENT_AMBULATORY_CARE_PROVIDER_SITE_OTHER): Payer: 59

## 2020-05-18 DIAGNOSIS — M21621 Bunionette of right foot: Secondary | ICD-10-CM

## 2020-05-18 NOTE — Progress Notes (Signed)
She presents again today for a another surgical consult regarding her right foot states is been so long she is forgotten what she is supposed to do postoperatively and what we would be doing intraoperatively.  States that she still has debilitating pain with to the fifth metatarsal area of the right foot.  Objective: Vital signs stable alert oriented x3.  She still has moderate tailor's bunion deformity radiographs confirmed lateral bowing of the fifth metatarsal distally consistent with a tailor's bunion deformity.  At this point neurologic sensorium is intact deep tendon reflexes are intact muscle strength is normal symmetrical.  There is an area of fluctuance beneath the fifth metatarsal consistent with a bursitis.  Assessment: Mild tailor's bunion deformity right.  Plan: Once again discussed the surgical consult and consent form today consisting of a osteotomy to the fifth metatarsal of the right foot.  She understands this and is amendable to it.  We did discuss possible postop complications which may include but not limited to postop pain bleeding swelling fracture recurrence need for further surgery overcorrection under correction loss of digit loss of limb loss of life.  She understands this is amenable to it.

## 2020-05-30 ENCOUNTER — Ambulatory Visit: Payer: 59 | Admitting: Adult Health

## 2020-06-01 ENCOUNTER — Encounter: Payer: 59 | Admitting: Podiatry

## 2020-06-06 ENCOUNTER — Telehealth: Payer: Self-pay

## 2020-06-06 NOTE — Telephone Encounter (Signed)
DOS 06/09/2020  METATARSAL OSTEOTOMY 5TH RT - 10175 APPLICATION OF CAST - 10258  RECEIVED APPROVED AUTH FAX FROM [facility]. AUTH # [id number] FOR CPT [id number] & [phone], GOOD FROM 06/09/2020 - 09/07/2020

## 2020-06-08 ENCOUNTER — Other Ambulatory Visit: Payer: Self-pay | Admitting: Podiatry

## 2020-06-08 MED ORDER — CLINDAMYCIN HCL 150 MG PO CAPS: 150.0000 mg | ORAL_CAPSULE | Freq: Three times a day (TID) | ORAL | 0 refills | Status: DC

## 2020-06-08 MED ORDER — OXYCODONE-ACETAMINOPHEN 10-325 MG PO TABS: 1.0000 | ORAL_TABLET | Freq: Three times a day (TID) | ORAL | 0 refills | Status: AC | PRN

## 2020-06-08 MED ORDER — ONDANSETRON HCL 4 MG PO TABS: 4.0000 mg | ORAL_TABLET | Freq: Three times a day (TID) | ORAL | 0 refills | Status: DC | PRN

## 2020-06-09 DIAGNOSIS — M21541 Acquired clubfoot, right foot: Secondary | ICD-10-CM

## 2020-06-15 ENCOUNTER — Encounter: Payer: Self-pay | Admitting: Podiatry

## 2020-06-15 ENCOUNTER — Ambulatory Visit (INDEPENDENT_AMBULATORY_CARE_PROVIDER_SITE_OTHER): Payer: 59 | Admitting: Podiatry

## 2020-06-15 ENCOUNTER — Ambulatory Visit (INDEPENDENT_AMBULATORY_CARE_PROVIDER_SITE_OTHER): Payer: 59

## 2020-06-15 ENCOUNTER — Other Ambulatory Visit: Payer: Self-pay

## 2020-06-15 VITALS — BP 119/86 | HR 80 | Temp 99.2°F

## 2020-06-15 DIAGNOSIS — M21621 Bunionette of right foot: Secondary | ICD-10-CM | POA: Diagnosis not present

## 2020-06-15 DIAGNOSIS — Z9889 Other specified postprocedural states: Secondary | ICD-10-CM

## 2020-06-16 NOTE — Progress Notes (Signed)
Ann Campbell presents today date of surgery 06/09/2020 She is status post fifth metatarsal osteotomy with double screw fixation fifth right.  She is in a cast states that it really has not hurt at all.  She has not had to take any pain medicine.  Objective: Vital signs are stable she alert oriented x3.  Cast is intact dry and clean and cleverly painted.  Cast is not tight proximally she is able to move the toes distally.  Toes are pink to the touch.  Radiographs taken today demonstrate demonstrate an osseously mature individual with the fifth metatarsal osteotomy internal fixation is in place.  Assessment well-healing surgical foot.  Plan: We will remove his cast in 1 week replacing it with another cast and will leave that cast on for 2 more weeks and then a cam walker for 2 weeks all nonweightbearing follow-up with her in 1

## 2020-06-22 ENCOUNTER — Ambulatory Visit (INDEPENDENT_AMBULATORY_CARE_PROVIDER_SITE_OTHER): Payer: 59

## 2020-06-22 ENCOUNTER — Encounter: Payer: Self-pay | Admitting: Podiatry

## 2020-06-22 ENCOUNTER — Ambulatory Visit (INDEPENDENT_AMBULATORY_CARE_PROVIDER_SITE_OTHER): Payer: 59 | Admitting: Podiatry

## 2020-06-22 ENCOUNTER — Other Ambulatory Visit: Payer: Self-pay

## 2020-06-22 DIAGNOSIS — M21621 Bunionette of right foot: Secondary | ICD-10-CM

## 2020-06-23 NOTE — Progress Notes (Signed)
Subjective:   Patient ID: Ann Campbell, female   DOB: 45 y.o.   MRN: [id number]   HPI Patient presents after having surgery by Dr. Milinda Pointer 6 days ago.  She is in a cast and using a rollabout for ambulation.  States she is having minimal discomfort   ROS      Objective:  Physical Exam  Neurovascular status intact to the digits with no signs of pathology cast intact and tolerated well     Assessment:  Doing well post fifth metatarsal osteotomy right     Plan:  We will get a go ahead and keep her in the cast for this week and I advised on continued complete nonweightbearing until able to see Dr. Milinda Pointer  X-rays indicate there is been an osteotomy fifth metatarsal screws in place

## 2020-06-29 ENCOUNTER — Ambulatory Visit (INDEPENDENT_AMBULATORY_CARE_PROVIDER_SITE_OTHER): Payer: 59 | Admitting: Podiatry

## 2020-06-29 ENCOUNTER — Encounter: Payer: Self-pay | Admitting: Podiatry

## 2020-06-29 ENCOUNTER — Other Ambulatory Visit: Payer: Self-pay

## 2020-06-29 DIAGNOSIS — M21621 Bunionette of right foot: Secondary | ICD-10-CM

## 2020-06-29 DIAGNOSIS — Z9889 Other specified postprocedural states: Secondary | ICD-10-CM

## 2020-07-01 NOTE — Progress Notes (Signed)
She presents today for a third postop visit date of surgery is June 09, 2020 fifth metatarsal osteotomy right foot.  Denies fever chills nausea vomiting muscle aches pains calf pain back pain chest pain shortness of breath.  Cast was intact dry and clean once removed patient commented on how straight her foot was she love the way that it looked.  Objective: Dry sterile dressing was intact with cast once the cast and dressing were removed revealed a surgical foot fifth metatarsal osteotomy incision which is well coapted no erythema cellulitis drainage or odor.  The toe was rectus there was no bump on the outside of the foot.  No signs of infection at all.  Assessment: Well-healing surgical foot.  Plan: Redressed today dressed a compressive dressing and placed a shoulder below-knee fiberglass cast to be left on for at least 2weeks and that right foot and then we will rex-ray her when she comes in next.  May need to consider a cast for another couple of weeks or if very least cam walker.

## 2020-07-06 ENCOUNTER — Encounter: Payer: 59 | Admitting: Podiatry

## 2020-07-13 ENCOUNTER — Ambulatory Visit (INDEPENDENT_AMBULATORY_CARE_PROVIDER_SITE_OTHER): Payer: 59 | Admitting: Podiatry

## 2020-07-13 ENCOUNTER — Other Ambulatory Visit: Payer: Self-pay

## 2020-07-13 ENCOUNTER — Ambulatory Visit (INDEPENDENT_AMBULATORY_CARE_PROVIDER_SITE_OTHER): Payer: 59

## 2020-07-13 ENCOUNTER — Encounter: Payer: Self-pay | Admitting: Podiatry

## 2020-07-13 DIAGNOSIS — Z9889 Other specified postprocedural states: Secondary | ICD-10-CM

## 2020-07-13 DIAGNOSIS — M21621 Bunionette of right foot: Secondary | ICD-10-CM

## 2020-07-13 NOTE — Progress Notes (Signed)
She presents today date of surgery January 21st 2022 fifth metatarsal osteotomy right foot with a cast.  At this point she denies fever chills nausea vomiting muscle aches pains states she is doing just great.  Objective: Cast is intact dry and clean.  Was removed demonstrates minimal edema no erythema cellulitis drainage or odor went ahead and removed the sutures today margins remain well coapted.  No signs of infection.  Radiographs taken today demonstrate well healing fifth metatarsal osteotomy.  Assessment: Well-healing surgical foot.  Plan: At this point put her back in her cam walker and then she will remain nonweightbearing for another 2 weeks.  I will follow-up with her at that time for another set of x-rays.

## 2020-07-20 ENCOUNTER — Encounter: Payer: 59 | Admitting: Podiatry

## 2020-07-27 ENCOUNTER — Encounter: Payer: Self-pay | Admitting: Podiatry

## 2020-07-27 ENCOUNTER — Other Ambulatory Visit: Payer: Self-pay | Admitting: Podiatry

## 2020-07-27 ENCOUNTER — Other Ambulatory Visit: Payer: Self-pay

## 2020-07-27 ENCOUNTER — Ambulatory Visit: Payer: 59

## 2020-07-27 ENCOUNTER — Ambulatory Visit (INDEPENDENT_AMBULATORY_CARE_PROVIDER_SITE_OTHER): Payer: 59 | Admitting: Podiatry

## 2020-07-27 ENCOUNTER — Ambulatory Visit (INDEPENDENT_AMBULATORY_CARE_PROVIDER_SITE_OTHER): Payer: 59

## 2020-07-27 DIAGNOSIS — Z9889 Other specified postprocedural states: Secondary | ICD-10-CM

## 2020-07-27 DIAGNOSIS — M21621 Bunionette of right foot: Secondary | ICD-10-CM

## 2020-07-27 NOTE — Progress Notes (Signed)
She presents today date of surgery 06/09/2020 status post metatarsal osteotomy fifth right.  She tolerated the procedure well she was casted and we have removed the cast for the second time last time I saw her.  At this point she is about 6 weeks out and radiographs will be taken today.  She denies fever chills nausea vomiting muscle aches and pains states that she has been doing very well nonweightbearing utilizing her knee scooter.  Objective: Presents today with her knee scooter and a dry clean boot plantarly.  Was removed demonstrates dressed her dressing intact once removed demonstrates no erythema just some mild edema no cellulitis drainage or odor.  Radiographs taken today demonstrate what appears to be a medial shift of the osteotomy site of the capital fragment site but the joint alignment is still good there is no elevation of the toe.  Assessment acceptable medial deviation of the osteotomy site.  It appears that healing is taking place.  Plan: Discussed etiology pathology conservative versus surgical therapies at this point placed her back in her cam walker for a couple more weeks at which time hopefully will go into a surgical shoe next visit x-rays will be performed next visit and I will follow-up with her at that point

## 2020-08-17 ENCOUNTER — Ambulatory Visit (INDEPENDENT_AMBULATORY_CARE_PROVIDER_SITE_OTHER): Payer: 59

## 2020-08-17 ENCOUNTER — Encounter: Payer: Self-pay | Admitting: Podiatry

## 2020-08-17 ENCOUNTER — Other Ambulatory Visit: Payer: Self-pay

## 2020-08-17 ENCOUNTER — Ambulatory Visit (INDEPENDENT_AMBULATORY_CARE_PROVIDER_SITE_OTHER): Payer: 59 | Admitting: Podiatry

## 2020-08-17 DIAGNOSIS — M21621 Bunionette of right foot: Secondary | ICD-10-CM

## 2020-08-17 DIAGNOSIS — Z9889 Other specified postprocedural states: Secondary | ICD-10-CM

## 2020-08-17 NOTE — Progress Notes (Signed)
She presents today for a follow-up of her metatarsal osteotomy fifth right.  States that is doing much better walk around the house without my boot and it seems to be doing fine.  She states that she is taking it easy she is not overdoing it.  She denies fever chills nausea vomit muscle aches and pains and states that the swelling seems to be going down.  She is ready for her trip to [place].  Objective: Vital signs are stable she is alert oriented x3 there is no erythema to some mild edema no cellulitis drainage or odor incision sites: Healed uneventfully.  She has no pain on palpation or range of motion of the fifth metatarsophalangeal joint of the fifth metatarsal at all.  Radiographs taken today demonstrates slowly healing fifth metatarsal osteotomy with screws intact it appears that the proximalmost screw may not be through the osteotomy site at this point.  I think it has been positioning of the x-rays that have not caught this before but I feel that the osteotomy itself has not moved.  She looks very good at this point.  Assessment: Well-healing surgical foot.  Plan: I am going to recommend that she continue trying to walk in her regular shoes to make sure that she carries her boot with her to [place] and I will follow-up with her as needed.  Or I would like to follow-up with her in 1 month.  That will be her final set of x-rays

## 2020-09-21 ENCOUNTER — Encounter: Payer: 59 | Admitting: Podiatry

## 2020-10-24 ENCOUNTER — Other Ambulatory Visit: Payer: Self-pay

## 2020-10-24 ENCOUNTER — Ambulatory Visit (INDEPENDENT_AMBULATORY_CARE_PROVIDER_SITE_OTHER): Payer: 59 | Admitting: Podiatry

## 2020-10-24 ENCOUNTER — Ambulatory Visit (INDEPENDENT_AMBULATORY_CARE_PROVIDER_SITE_OTHER): Payer: 59

## 2020-10-24 DIAGNOSIS — M21621 Bunionette of right foot: Secondary | ICD-10-CM

## 2020-10-24 NOTE — Progress Notes (Signed)
She presents today for postop visit date of surgery 05/21/2020 fifth metatarsal osteotomy right foot.  States that seems to be doing pretty well went to [place] and walked all over [place] and it did fine.  She is gets a little residual stiffness otherwise she states is doing really well.  Objective: Vital signs temps alert orient x3 she still has some swelling and scar tissue overlying the fifth metatarsal of the right foot.  Radiographically appears to have a little plantar spicule that is still present but appears to be in the stages of remodeling.  Assessment well-healing surgical foot.  Plan: Follow-up with me on an as-needed basis encouraged her to massage the toes and the lateral aspect of the right foot.

## 2021-03-26 ENCOUNTER — Other Ambulatory Visit: Payer: Self-pay | Admitting: Adult Health

## 2021-05-02 ENCOUNTER — Other Ambulatory Visit (HOSPITAL_COMMUNITY): Payer: Self-pay | Admitting: Adult Health

## 2021-05-02 DIAGNOSIS — Z1231 Encounter for screening mammogram for malignant neoplasm of breast: Secondary | ICD-10-CM

## 2021-05-03 ENCOUNTER — Ambulatory Visit (HOSPITAL_COMMUNITY)
Admission: RE | Admit: 2021-05-03 | Discharge: 2021-05-03 | Disposition: A | Discharge status: Home / Self Care | Payer: 59 | Source: Ambulatory Visit | Attending: Adult Health | Admitting: Adult Health

## 2021-05-03 ENCOUNTER — Other Ambulatory Visit: Payer: Self-pay

## 2021-05-03 DIAGNOSIS — Z1231 Encounter for screening mammogram for malignant neoplasm of breast: Secondary | ICD-10-CM | POA: Insufficient documentation

## 2021-05-10 ENCOUNTER — Other Ambulatory Visit: Payer: 59 | Admitting: Adult Health

## 2021-06-13 ENCOUNTER — Other Ambulatory Visit: Payer: Self-pay | Admitting: Adult Health

## 2021-06-27 ENCOUNTER — Other Ambulatory Visit: Payer: Self-pay | Admitting: Advanced Practice Midwife

## 2021-07-17 DIAGNOSIS — D2239 Melanocytic nevi of other parts of face: Secondary | ICD-10-CM | POA: Diagnosis not present

## 2021-07-17 DIAGNOSIS — B360 Pityriasis versicolor: Secondary | ICD-10-CM | POA: Diagnosis not present

## 2021-07-17 DIAGNOSIS — D2262 Melanocytic nevi of left upper limb, including shoulder: Secondary | ICD-10-CM | POA: Diagnosis not present

## 2021-07-17 DIAGNOSIS — D2261 Melanocytic nevi of right upper limb, including shoulder: Secondary | ICD-10-CM | POA: Diagnosis not present

## 2021-07-18 ENCOUNTER — Encounter: Payer: Self-pay | Admitting: Advanced Practice Midwife

## 2021-07-18 ENCOUNTER — Other Ambulatory Visit (HOSPITAL_COMMUNITY)
Admission: RE | Admit: 2021-07-18 | Discharge: 2021-07-18 | Disposition: A | Discharge status: Home / Self Care | Payer: BC Managed Care – PPO | Source: Ambulatory Visit | Attending: Adult Health | Admitting: Adult Health

## 2021-07-18 ENCOUNTER — Encounter (INDEPENDENT_AMBULATORY_CARE_PROVIDER_SITE_OTHER): Payer: Self-pay | Admitting: *Deleted

## 2021-07-18 ENCOUNTER — Other Ambulatory Visit: Payer: Self-pay

## 2021-07-18 ENCOUNTER — Ambulatory Visit (INDEPENDENT_AMBULATORY_CARE_PROVIDER_SITE_OTHER): Payer: BC Managed Care – PPO | Admitting: Advanced Practice Midwife

## 2021-07-18 VITALS — BP 122/88 | HR 76 | Ht 65.0 in | Wt 173.0 lb

## 2021-07-18 DIAGNOSIS — Z01419 Encounter for gynecological examination (general) (routine) without abnormal findings: Secondary | ICD-10-CM | POA: Insufficient documentation

## 2021-07-18 DIAGNOSIS — Z1212 Encounter for screening for malignant neoplasm of rectum: Secondary | ICD-10-CM

## 2021-07-18 NOTE — Progress Notes (Signed)
? ?WELL-WOMAN EXAMINATION ?Patient name: Ann Campbell MRN [id number]  Date of birth: [birth year] ?Chief Complaint:   ?Gynecologic Exam and Annual Exam ? ?History of Present Illness:   ?Ann Campbell is a 46 y.o. [id number] Caucasian female being seen today for a routine well-woman exam.  ?Current complaints: doing well overall; having some perimenopausal symptoms consisting of mainly mood swings and occ vag dryness; still with reg cycles; dx with HTN in Nov 2021 during visit with Lavone Nian- has since purchased home BP cuff and has values <139/90 (thinks was related OCPs and has since stopped) ? ?PCP: Dr Jimmye Norman- [facility] (changing to this MD)      ?does not desire labs ?Patient's last menstrual period was 06/27/2021 (exact date). ?The current method of family planning is  husband on HRT .  ?Last pap Sept 2021. Results were: NILM w/ HRHPV negative. H/O abnormal pap: no ?Last mammogram: Dec 2022. Results were: normal. Family h/o breast cancer: no ?Last colonoscopy: approx 79yrs ago; requesting referral to [place]. Results were: normal. Family h/o colorectal cancer: yes GM dx in her 77s ? ?Depression screen [facility] 2/9 07/18/2021 01/27/2020 05/08/2018 04/21/2017  ?Decreased Interest 0 0 0 0  ?Down, Depressed, Hopeless 0 0 0 0  ?PHQ - 2 Score 0 0 0 0  ?Altered sleeping 0 0 - 0  ?Tired, decreased energy 0 0 - 0  ?Change in appetite 0 0 - 0  ?Feeling bad or failure about yourself  0 0 - 0  ?Trouble concentrating 0 0 - 0  ?Moving slowly or fidgety/restless 0 0 - 0  ?Suicidal thoughts 0 0 - 0  ?PHQ-9 Score 0 0 - 0  ?Difficult doing work/chores - Not difficult at all - -  ? ?  ?GAD 7 : Generalized Anxiety Score 07/18/2021 01/27/2020  ?Nervous, Anxious, on Edge 0 0  ?Control/stop worrying 0 0  ?Worry too much - different things 0 0  ?Trouble relaxing 0 0  ?Restless 0 0  ?Easily annoyed or irritable 0 1  ?Afraid - awful might happen 0 0  ?Total GAD 7 Score 0 1  ?Anxiety Difficulty - Not difficult at all  ? ? ? ?Review of Systems:   ?Pertinent  items are noted in HPI ?Denies any headaches, blurred vision, fatigue, shortness of breath, chest pain, abdominal pain, abnormal vaginal discharge/itching/odor/irritation, problems with periods, bowel movements, urination, or intercourse unless otherwise stated above. ?Pertinent History Reviewed:  ?Reviewed past medical,surgical, social and family history.  ?Reviewed problem list, medications and allergies. ?Physical Assessment:  ? ?Vitals:  ? 07/18/21 1108  ?BP: 122/88  ?Pulse: 76  ?Weight: 173 lb (78.5 kg)  ?Height: 5\' 5"  (1.651 m)  ?Body mass index is 28.79 kg/m?. ?  ?     Physical Examination:  ? General appearance - well appearing, and in no distress ? Mental status - alert, oriented to person, place, and time ? Psych:  She has a normal mood and affect ? Skin - warm and dry, normal color, no suspicious lesions noted ? Chest - effort normal, all lung fields clear to auscultation bilaterally ? Heart - normal rate and regular rhythm ? Neck:  midline trachea, no thyromegaly or nodules ? Breasts - breasts appear normal, no suspicious masses, no skin or nipple changes or  axillary nodes ? Abdomen - soft, nontender, nondistended, no masses or organomegaly ? Pelvic - VULVA: normal appearing vulva with no masses, tenderness or lesions  VAGINA: normal appearing vagina with normal color and discharge, no lesions  CERVIX:  normal appearing cervix without discharge or lesions, no CMT ? Thin prep pap is done with HR HPV cotesting (Pap requested) ? UTERUS: uterus is felt to be normal size, shape, consistency and nontender  ? ADNEXA: No adnexal masses or tenderness noted. ? Extremities:  No swelling or varicosities noted ? ?Chaperone: Glenard Haring Neas   ? ?No results found for this or any previous visit (from the past 24 hour(s)).  ?Assessment & Plan:  ?1) Well-Woman Exam ? ?2) Perimenopausal, managing symptoms with [place] ? ?Labs/procedures today: Pap ? ?Mammogram:  in Dec 2023 , or sooner if problems ?Colonoscopy:  referral to  [facility] , or sooner if problems ? ?Orders Placed This Encounter  ?Procedures  ? Ambulatory referral to Gastroenterology  ? ? ?Meds: No orders of the defined types were placed in this encounter. ? ? ?Follow-up: Return for prn. ? ?Myrtis Ser CNM ?07/18/2021 ?11:59 AM  ?

## 2021-07-23 ENCOUNTER — Telehealth: Payer: Self-pay

## 2021-07-23 LAB — CYTOLOGY - PAP
Comment: NEGATIVE
Comment: NEGATIVE
Diagnosis: NEGATIVE
HPV 16: NEGATIVE
HPV 18 / 45: NEGATIVE
High risk HPV: POSITIVE — AB

## 2021-07-23 NOTE — Telephone Encounter (Signed)
PT CALLED AND WANTED TO SPEAK WITH SOMEONE ABOUT HER TEST RESULTS. ?

## 2021-07-23 NOTE — Telephone Encounter (Signed)
Returned pt's call, two identifiers used. Relayed test results and answered questions. Phone cut off during call. ?

## 2021-07-24 ENCOUNTER — Encounter: Payer: Self-pay | Admitting: Advanced Practice Midwife

## 2021-07-24 DIAGNOSIS — R87810 Cervical high risk human papillomavirus (HPV) DNA test positive: Secondary | ICD-10-CM | POA: Insufficient documentation

## 2021-07-24 NOTE — Telephone Encounter (Signed)
Called pt, two identifiers used. Answered pt's questions. Pt confirmed understanding. ?

## 2021-07-27 DIAGNOSIS — Z131 Encounter for screening for diabetes mellitus: Secondary | ICD-10-CM | POA: Diagnosis not present

## 2021-07-27 DIAGNOSIS — Z0000 Encounter for general adult medical examination without abnormal findings: Secondary | ICD-10-CM | POA: Diagnosis not present

## 2021-07-27 DIAGNOSIS — Z13220 Encounter for screening for lipoid disorders: Secondary | ICD-10-CM | POA: Diagnosis not present

## 2021-07-27 DIAGNOSIS — Z1329 Encounter for screening for other suspected endocrine disorder: Secondary | ICD-10-CM | POA: Diagnosis not present

## 2021-07-31 DIAGNOSIS — Z23 Encounter for immunization: Secondary | ICD-10-CM | POA: Diagnosis not present

## 2021-07-31 DIAGNOSIS — Z0000 Encounter for general adult medical examination without abnormal findings: Secondary | ICD-10-CM | POA: Diagnosis not present

## 2021-08-01 ENCOUNTER — Encounter (INDEPENDENT_AMBULATORY_CARE_PROVIDER_SITE_OTHER): Payer: Self-pay | Admitting: *Deleted

## 2021-08-01 ENCOUNTER — Telehealth (INDEPENDENT_AMBULATORY_CARE_PROVIDER_SITE_OTHER): Payer: Self-pay | Admitting: *Deleted

## 2021-08-01 MED ORDER — NA SULFATE-K SULFATE-MG SULF 17.5-3.13-1.6 GM/177ML PO SOLN: 1.0000 | Freq: Once | ORAL | 0 refills | Status: AC

## 2021-08-01 NOTE — Telephone Encounter (Signed)
Patient needs suprep 

## 2021-08-02 ENCOUNTER — Other Ambulatory Visit (INDEPENDENT_AMBULATORY_CARE_PROVIDER_SITE_OTHER): Payer: Self-pay

## 2021-08-15 ENCOUNTER — Telehealth (INDEPENDENT_AMBULATORY_CARE_PROVIDER_SITE_OTHER): Payer: Self-pay | Admitting: *Deleted

## 2021-08-15 NOTE — Telephone Encounter (Signed)
Referring MD: Derrek Monaco   PCP: Jimmye Norman ? ?Procedure: tcs ? ?Reason/Indication:  screening, fam hx colon ca ? ?Has patient had this procedure before?  no ? If so, when, by whom and where?   ? ?Is there a family history of colon cancer?  Yes, grandmother ? Who?  What age when diagnosed?   ? ?Is patient diabetic? If yes, Type 1 or Type 2   np ?     ?Does patient have prosthetic heart valve or mechanical valve?  np ? ?Do you have a pacemaker/defibrillator?  np ? ?Has patient ever had endocarditis/atrial fibrillation? np ? ?Does patient use oxygen? np ? ?Has patient had joint replacement within last 12 months?  np ? ?Is patient constipated or do they take laxatives? np ? ?Does patient have a history of alcohol/drug use?  np ? ?Have you had a stroke/heart attack last 6 mths? no ? ?Do you take medicine for weight loss?  no ? ?For female patients,: have you had a hysterectomy  ?                     are you post menopausal  ?                     do you still have your menstrual cycle yes ? ?Is patient on blood thinner such as Coumadin, Plavix and/or Aspirin? no ? ?Medications: zoloft 50 mg daily, Claritin  ? ?Allergies: sulfa ? ?Medication Adjustment per Dr Rehman/Dr Jenetta Downer  ? ?Procedure date & time: 09/13/21 ? ? ?

## 2021-09-05 ENCOUNTER — Other Ambulatory Visit (INDEPENDENT_AMBULATORY_CARE_PROVIDER_SITE_OTHER): Payer: Self-pay

## 2021-09-05 DIAGNOSIS — Z1211 Encounter for screening for malignant neoplasm of colon: Secondary | ICD-10-CM

## 2021-09-05 DIAGNOSIS — Z01812 Encounter for preprocedural laboratory examination: Secondary | ICD-10-CM

## 2021-09-11 ENCOUNTER — Other Ambulatory Visit (HOSPITAL_COMMUNITY)
Admission: RE | Admit: 2021-09-11 | Discharge: 2021-09-11 | Disposition: A | Discharge status: Home / Self Care | Payer: BC Managed Care – PPO | Source: Ambulatory Visit | Attending: Internal Medicine | Admitting: Internal Medicine

## 2021-09-11 DIAGNOSIS — D123 Benign neoplasm of transverse colon: Secondary | ICD-10-CM | POA: Diagnosis not present

## 2021-09-11 DIAGNOSIS — Z1211 Encounter for screening for malignant neoplasm of colon: Secondary | ICD-10-CM | POA: Insufficient documentation

## 2021-09-11 DIAGNOSIS — D122 Benign neoplasm of ascending colon: Secondary | ICD-10-CM | POA: Diagnosis not present

## 2021-09-11 DIAGNOSIS — Z800 Family history of malignant neoplasm of digestive organs: Secondary | ICD-10-CM | POA: Diagnosis not present

## 2021-09-11 DIAGNOSIS — K6289 Other specified diseases of anus and rectum: Secondary | ICD-10-CM | POA: Diagnosis not present

## 2021-09-11 LAB — PREGNANCY, URINE: Preg Test, Ur: NEGATIVE

## 2021-09-13 ENCOUNTER — Ambulatory Visit (HOSPITAL_COMMUNITY): Payer: BC Managed Care – PPO | Admitting: Anesthesiology

## 2021-09-13 ENCOUNTER — Encounter (HOSPITAL_COMMUNITY)
Admission: RE | Disposition: A | Discharge status: Home / Self Care | Payer: Self-pay | Source: Home / Self Care | Attending: Internal Medicine

## 2021-09-13 ENCOUNTER — Encounter (INDEPENDENT_AMBULATORY_CARE_PROVIDER_SITE_OTHER): Payer: Self-pay | Admitting: *Deleted

## 2021-09-13 ENCOUNTER — Ambulatory Visit (HOSPITAL_COMMUNITY)
Admission: RE | Admit: 2021-09-13 | Discharge: 2021-09-13 | Disposition: A | Discharge status: Home / Self Care | Payer: BC Managed Care – PPO | Attending: Internal Medicine | Admitting: Internal Medicine

## 2021-09-13 ENCOUNTER — Other Ambulatory Visit: Payer: Self-pay

## 2021-09-13 ENCOUNTER — Encounter (HOSPITAL_COMMUNITY): Payer: Self-pay | Admitting: Internal Medicine

## 2021-09-13 DIAGNOSIS — D122 Benign neoplasm of ascending colon: Secondary | ICD-10-CM | POA: Diagnosis not present

## 2021-09-13 DIAGNOSIS — K6289 Other specified diseases of anus and rectum: Secondary | ICD-10-CM | POA: Diagnosis not present

## 2021-09-13 DIAGNOSIS — Z1211 Encounter for screening for malignant neoplasm of colon: Secondary | ICD-10-CM | POA: Insufficient documentation

## 2021-09-13 DIAGNOSIS — K635 Polyp of colon: Secondary | ICD-10-CM | POA: Diagnosis not present

## 2021-09-13 DIAGNOSIS — D123 Benign neoplasm of transverse colon: Secondary | ICD-10-CM | POA: Diagnosis not present

## 2021-09-13 DIAGNOSIS — Z800 Family history of malignant neoplasm of digestive organs: Secondary | ICD-10-CM | POA: Insufficient documentation

## 2021-09-13 HISTORY — PX: HEMOSTASIS CLIP PLACEMENT: SHX6857

## 2021-09-13 HISTORY — PX: HOT HEMOSTASIS: SHX5433

## 2021-09-13 HISTORY — PX: COLONOSCOPY WITH PROPOFOL: SHX5780

## 2021-09-13 HISTORY — PX: SUBMUCOSAL LIFTING INJECTION: SHX6855

## 2021-09-13 HISTORY — PX: POLYPECTOMY: SHX5525

## 2021-09-13 LAB — HM COLONOSCOPY

## 2021-09-13 SURGERY — COLONOSCOPY WITH PROPOFOL
Anesthesia: General

## 2021-09-13 MED ORDER — PROPOFOL 10 MG/ML IV BOLUS
INTRAVENOUS | Status: DC | PRN
  Administered 2021-09-13: 100 mg via INTRAVENOUS

## 2021-09-13 MED ORDER — PROPOFOL 1000 MG/100ML IV EMUL
INTRAVENOUS | Status: AC
  Filled 2021-09-13: qty 300

## 2021-09-13 MED ORDER — PROPOFOL 500 MG/50ML IV EMUL
INTRAVENOUS | Status: DC | PRN
  Administered 2021-09-13: 150 ug/kg/min via INTRAVENOUS

## 2021-09-13 MED ORDER — LACTATED RINGERS IV SOLN: INTRAVENOUS | Status: DC

## 2021-09-13 MED ORDER — LIDOCAINE HCL (CARDIAC) PF 100 MG/5ML IV SOSY
PREFILLED_SYRINGE | INTRAVENOUS | Status: DC | PRN
  Administered 2021-09-13: 50 mg via INTRAVENOUS

## 2021-09-13 NOTE — Op Note (Signed)
[facility] ?Patient Name: Ann Campbell ?Procedure Date: 09/13/2021 8:39 AM ?MRN: [id number] ?Date of Birth: Jan 13, 1976 ?Attending MD: Hildred Laser , MD ?CSN: [id number] ?Age: 46 ?Admit Type: Outpatient ?Procedure:                Colonoscopy ?Indications:              Screening for colorectal malignant neoplasm ?Providers:                Hildred Laser, MD, Charlsie Quest Theda Sers RN, RN, Kenney Houseman  ?                          [place] ?Referring MD:             Drema Dallas. Jimmye Norman, MD ?Medicines:                Propofol per Anesthesia ?Complications:            No immediate complications. ?Estimated Blood Loss:     Estimated blood loss was minimal. ?Procedure:                Pre-Anesthesia Assessment: ?                          - Prior to the procedure, a History and Physical  ?                          was performed, and patient medications and  ?                          allergies were reviewed. The patient's tolerance of  ?                          previous anesthesia was also reviewed. The risks  ?                          and benefits of the procedure and the sedation  ?                          options and risks were discussed with the patient.  ?                          All questions were answered, and informed consent  ?                          was obtained. Prior Anticoagulants: The patient has  ?                          taken no previous anticoagulant or antiplatelet  ?                          agents. ASA Grade Assessment: I - A normal, healthy  ?                          patient. After reviewing the risks and benefits,  ?  the patient was deemed in satisfactory condition to  ?                          undergo the procedure. ?                          After obtaining informed consent, the colonoscope  ?                          was passed under direct vision. Throughout the  ?                          procedure, the patient's blood pressure, pulse, and  ?                          oxygen  saturations were monitored continuously. The  ?                          PCF-HQ190L ([id number]) scope was introduced through  ?                          the anus and advanced to the the cecum, identified  ?                          by appendiceal orifice and ileocecal valve. The  ?                          colonoscopy was performed without difficulty. The  ?                          patient tolerated the procedure well. The quality  ?                          of the bowel preparation was good. The ileocecal  ?                          valve, appendiceal orifice, and rectum were  ?                          photographed. ?Scope In: 9:03:28 AM ?Scope Out: 9:46:32 AM ?Scope Withdrawal Time: 0 hours 35 minutes 16 seconds  ?Total Procedure Duration: 0 hours 43 minutes 4 seconds  ?Findings: ?     The perianal and digital rectal examinations were normal. ?     A small polyp was found in the proximal ascending colon. The polyp was  ?     removed with a cold snare. Resection and retrieval were complete. The  ?     pathology specimen was placed into Bottle Number 1. ?     A diminutive polyp was found in the proximal ascending colon. The polyp  ?     was flat. Biopsies were taken with a cold forceps for histology. The  ?     pathology specimen was placed into Bottle Number 1. ?     Two sessile polyps were found in the distal ascending colon. The polyps  ?  were small in size. These polyps were removed with a cold snare.  ?     Resection and retrieval were complete. The pathology specimen was placed  ?     into Bottle Number 2. ?     A small polyp was found in the distal ascending colon. Biopsies were  ?     taken with a cold forceps for histology. The pathology specimen was  ?     placed into Bottle Number 2. ?     A 7 mm polyp was found in the hepatic flexure. The polyp was sessile.  ?     The polyp was removed with a cold snare. Resection and retrieval were  ?     complete. The pathology specimen was placed into Bottle  Number 2. ?     A 10 mm polyp was found in the hepatic flexure. The polyp was flat. Area  ?     was successfully injected with 3 mL Eleview for lesion assessment and  ?     demarcation as the polyp was irregular. And this injection appeared to  ?     lift the lesion adequately. The polyp was removed with a piecemeal  ?     technique using a hot snare. Resection and retrieval were complete.  ?     Coagulation for treat margins. using argon plasma was successful. To  ?     close a defect after mucosal resection, three hemostatic clips were  ?     successfully placed (MR conditional). There was no bleeding during, or  ?     at the end, of the procedure. The pathology specimen was placed into  ?     Bottle Number 2. ?     Two sessile polyps were found in the mid ascending colon. The polyps  ?     were small in size. These polyps were removed with a cold snare.  ?     Resection and retrieval were complete. The pathology specimen was placed  ?     into Bottle Number 3. ?     The exam was otherwise normal throughout the examined colon. ?     Anal papilla(e) were hypertrophied. ?Impression:               - One small polyp in the proximal ascending colon,  ?                          removed with a cold snare. Resected and retrieved. ?                          - One diminutive polyp in the proximal ascending  ?                          colon. Biopsied. ?                          - Two small polyps in the distal ascending colon,  ?                          removed with a cold snare. Resected and retrieved. ?                          -  One small polyp in the distal ascending colon.  ?                          Biopsied. ?                          - One 7 mm polyp at the hepatic flexure, removed  ?                          with a cold snare. Resected and retrieved. ?                          - One 10 mm polyp at the hepatic flexure, removed  ?                          piecemeal using a hot snare. Resected and  ?                           retrieved. Injected. Treated with argon plasma  ?                          coagulation (APC). Clips (MR conditional) were  ?                          placed. ?                          - Two small polyps in the mid ascending colon,  ?                          removed with a cold snare. Resected and retrieved. ?                          - Anal papilla(e) were hypertrophied. ?Moderate Sedation: ?     Per Anesthesia Care ?Recommendation:           - Patient has a contact number available for  ?                          emergencies. The signs and symptoms of potential  ?                          delayed complications were discussed with the  ?                          patient. Return to normal activities tomorrow.  ?                          Written discharge instructions were provided to the  ?                          patient. ?                          - Resume previous diet today. ?                          -  Continue present medications. ?                          - No aspirin, ibuprofen, naproxen, or other  ?                          non-steroidal anti-inflammatory drugs for 3 days. ?                          - Await pathology results. ?                          - Repeat colonoscopy is recommended. The  ?                          colonoscopy date will be determined after pathology  ?                          results from today's exam become available for  ?                          review. ?Procedure Code(s):        --- Professional --- ?                          [phone], Colonoscopy, flexible; with removal of  ?                          tumor(s), polyp(s), or other lesion(s) by snare  ?                          technique ?                          45380, 59, Colonoscopy, flexible; with biopsy,  ?                          single or multiple ?                          45381, Colonoscopy, flexible; with directed  ?                          submucosal injection(s), any substance ?Diagnosis Code(s):        ---  Professional --- ?                          K63.5, Polyp of colon ?                          Z12.11, Encounter for screening for malignant  ?                          neoplasm of colon ?

## 2021-09-13 NOTE — H&P (Signed)
Ann Campbell is an 46 y.o. female.   ?Chief Complaint: Patient is here for colonoscopy ?HPI: Patient is a 46 year old Caucasian female who is here for screening colonoscopy.  Last exam was 12 years ago when she had problems with bowel movements and she had anal fissure requiring surgery.  Her bowels are now regular.  She denies abdominal pain or rectal bleeding. ?Her grandmother had colon carcinoma in her late 41s or 27s. ?Patient does not take aspirin or anticoagulants. ? ? ?Past Medical History:  ?Diagnosis Date  ? Anxiety   ? Contraceptive management 07/20/2014  ? Menstrual cramps 07/20/2014  ? PMS (premenstrual syndrome) 07/20/2014  ? Skin cyst 03/10/2013  ? Right clavicle area very mobile  ? ? ?Past Surgical History:  ?Procedure Laterality Date  ? FOOT SURGERY Right   ? ? ?Family History  ?Problem Relation Age of Onset  ? Fibromyalgia Mother   ? Hypertension Mother   ? Heart disease Maternal Grandmother   ? Heart disease Maternal Grandfather   ? Cancer Paternal Grandmother   ?     colon  ? ?Social History:  reports that she has never smoked. She has never used smokeless tobacco. She reports current alcohol use. She reports that she does not use drugs. ? ?Allergies:  ?Allergies  ?Allergen Reactions  ? Bee Venom Anaphylaxis and Hives  ? Amoxicillin-Pot Clavulanate   ?  GI Intolerance  ? Sulfa Antibiotics Rash  ? ? ?Medications Prior to Admission  ?Medication Sig Dispense Refill  ? clindamycin (CLEOCIN T) 1 % SWAB Apply 1 each topically 2 (two) times daily as needed (dermatitis).    ? Coenzyme Q10 (COQ-10 PO) Take 1 tablet by mouth daily.    ? ketoconazole (NIZORAL) 2 % shampoo Apply 1 application. topically daily as needed for irritation.    ? loratadine (CLARITIN) 10 MG tablet Take 10 mg by mouth daily.    ? Melatonin 5 MG CAPS Take 5 mg by mouth at bedtime as needed (sleep).    ? Multiple Vitamin (MULTIVITAMIN WITH MINERALS) TABS tablet Take 1 tablet by mouth daily.    ? Omega-3 Fatty Acids (FISH OIL PO) Take 2  capsules by mouth daily.    ? Red Yeast Rice 600 MG CAPS Take 1,200 mg by mouth daily.    ? sertraline (ZOLOFT) 50 MG tablet Take 1 tablet by mouth once daily 90 tablet 3  ? EPINEPHrine 0.3 mg/0.3 mL IJ SOAJ injection Inject 0.3 mg into the muscle as needed for anaphylaxis.    ? ? ?Results for orders placed or performed during the hospital encounter of 09/11/21 (from the past 48 hour(s))  ?Pregnancy, urine     Status: None  ? Collection Time: 09/11/21 10:37 AM  ?Result Value Ref Range  ? Preg Test, Ur NEGATIVE NEGATIVE  ?  Comment:        ?THE SENSITIVITY OF THIS ?METHODOLOGY IS >20 mIU/mL. ?Performed at [facility], [address], [place], [place] [address] ?  ? ?No results found. ? ?Review of Systems ? ?Blood pressure 111/80, pulse 66, temperature 98.3 ?F (36.8 ?C), temperature source Oral, resp. rate 14, height '5\' 5"'$  (1.651 m), weight 75.8 kg, last menstrual period 08/20/2021, SpO2 98 %. ?Physical Exam ?HENT:  ?   Mouth/Throat:  ?   Mouth: Mucous membranes are moist.  ?   Pharynx: Oropharynx is clear.  ?Eyes:  ?   General: No scleral icterus. ?   Conjunctiva/sclera: Conjunctivae normal.  ?Cardiovascular:  ?   Rate and  Rhythm: Normal rate and regular rhythm.  ?   Heart sounds: Normal heart sounds. No murmur heard. ?Pulmonary:  ?   Effort: Pulmonary effort is normal.  ?   Breath sounds: Normal breath sounds.  ?Abdominal:  ?   General: There is no distension.  ?   Palpations: Abdomen is soft. There is no mass.  ?   Tenderness: There is no abdominal tenderness.  ?Musculoskeletal:     ?   General: No swelling.  ?   Cervical back: Neck supple.  ?Lymphadenopathy:  ?   Cervical: No cervical adenopathy.  ?Skin: ?   General: Skin is warm and dry.  ?Neurological:  ?   Mental Status: She is alert.  ?  ? ?Assessment/Plan ? ?Average risk screening colonoscopy. ? ?Hildred Laser, MD ?09/13/2021, 8:56 AM ? ? ? ?

## 2021-09-13 NOTE — Anesthesia Postprocedure Evaluation (Signed)
Anesthesia Post Note ? ?Patient: Ann Campbell ? ?Procedure(s) Performed: COLONOSCOPY WITH PROPOFOL ?POLYPECTOMY ?SUBMUCOSAL LIFTING INJECTION ?HOT HEMOSTASIS (ARGON PLASMA COAGULATION/BICAP) ?HEMOSTASIS CLIP PLACEMENT ? ?Patient location during evaluation: Phase II ?Anesthesia Type: General ?Level of consciousness: awake and alert and oriented ?Pain management: pain level controlled ?Vital Signs Assessment: post-procedure vital signs reviewed and stable ?Respiratory status: spontaneous breathing, nonlabored ventilation, respiratory function stable and patient connected to nasal cannula oxygen ?Cardiovascular status: blood pressure returned to baseline and stable ?Postop Assessment: no apparent nausea or vomiting ?Anesthetic complications: no ? ? ?No notable events documented. ? ? ?Last Vitals:  ?Vitals:  ? 09/13/21 0950 09/13/21 0954  ?BP:  (!) 89/56  ?Pulse: 64   ?Resp: 18   ?Temp: 36.6 ?C   ?SpO2: 99%   ?  ?Last Pain:  ?Vitals:  ? 09/13/21 0950  ?TempSrc: Oral  ?PainSc: 0-No pain  ? ? ?  ?  ?  ?  ?  ?  ? ?Collyn Selk C Kyra Laffey ? ? ? ? ?

## 2021-09-13 NOTE — Discharge Instructions (Addendum)
No aspirin or NSAIDs for 3 days. ?Resume usual medications and diet as before. ?No driving for 24 hours ?Physician will call with biopsy results. ?Remember you cannot have an MRI until clips have passed. ?

## 2021-09-13 NOTE — Anesthesia Preprocedure Evaluation (Signed)
Anesthesia Evaluation  ?Patient identified by MRN, date of birth, ID band ?Patient awake ? ? ? ?Reviewed: ?Allergy & Precautions, NPO status , Patient's Chart, lab work & pertinent test results ? ?Airway ?Mallampati: II ? ?TM Distance: >3 FB ?Neck ROM: Full ? ? ? Dental ? ?(+) Dental Advisory Given, Teeth Intact ?  ?Pulmonary ?neg pulmonary ROS,  ?  ?Pulmonary exam normal ?breath sounds clear to auscultation ? ? ? ? ? ? Cardiovascular ?negative cardio ROS ?Normal cardiovascular exam ?Rhythm:Regular Rate:Normal ? ? ?  ?Neuro/Psych ?negative neurological ROS ? negative psych ROS  ? GI/Hepatic ?negative GI ROS, Neg liver ROS,   ?Endo/Other  ?negative endocrine ROS ? Renal/GU ?negative Renal ROS  ?negative genitourinary ?  ?Musculoskeletal ?negative musculoskeletal ROS ?(+)  ? Abdominal ?  ?Peds ?negative pediatric ROS ?(+)  Hematology ?negative hematology ROS ?(+)   ?Anesthesia Other Findings ? ? Reproductive/Obstetrics ?negative OB ROS ? ?  ? ? ? ? ? ? ? ? ? ? ? ? ? ?  ?  ? ? ? ? ? ? ? ? ?Anesthesia Physical ?Anesthesia Plan ? ?ASA: 1 ? ?Anesthesia Plan: General  ? ?Post-op Pain Management: Minimal or no pain anticipated  ? ?Induction: Intravenous ? ?PONV Risk Score and Plan: TIVA ? ?Airway Management Planned: Nasal Cannula and Natural Airway ? ?Additional Equipment:  ? ?Intra-op Plan:  ? ?Post-operative Plan:  ? ?Informed Consent: I have reviewed the patients History and Physical, chart, labs and discussed the procedure including the risks, benefits and alternatives for the proposed anesthesia with the patient or authorized representative who has indicated his/her understanding and acceptance.  ? ? ? ?Dental advisory given ? ?Plan Discussed with: CRNA and Surgeon ? ?Anesthesia Plan Comments:   ? ? ? ? ? ? ?Anesthesia Quick Evaluation ? ?

## 2021-09-13 NOTE — Transfer of Care (Signed)
Immediate Anesthesia Transfer of Care Note ? ?Patient: Jared Cahn ? ?Procedure(s) Performed: COLONOSCOPY WITH PROPOFOL ?POLYPECTOMY ?SUBMUCOSAL LIFTING INJECTION ?HOT HEMOSTASIS (ARGON PLASMA COAGULATION/BICAP) ?HEMOSTASIS CLIP PLACEMENT ? ?Patient Location: Endoscopy Unit ? ?Anesthesia Type:General ? ?Level of Consciousness: awake ? ?Airway & Oxygen Therapy: Patient Spontanous Breathing ? ?Post-op Assessment: Report given to RN and Post -op Vital signs reviewed and stable ? ?Post vital signs: Reviewed and stable ? ?Last Vitals:  ?Vitals Value Taken Time  ?BP    ?Temp    ?Pulse 71   ?Resp 16   ?SpO2    ? ? ?Last Pain:  ?Vitals:  ? 09/13/21 0859  ?TempSrc:   ?PainSc: 0-No pain  ?   ? ?  ? ?Complications: No notable events documented. ?

## 2021-09-14 LAB — SURGICAL PATHOLOGY

## 2021-09-18 ENCOUNTER — Encounter (HOSPITAL_COMMUNITY): Payer: Self-pay | Admitting: Internal Medicine

## 2021-09-18 ENCOUNTER — Telehealth: Payer: Self-pay | Admitting: Genetic Counselor

## 2021-09-18 NOTE — Telephone Encounter (Signed)
Scheduled appt per 5/2 referral. Pt is aware of appt date and time. Pt is aware to arrive 15 mins prior to appt time and to bring and updated insurance card. Pt is aware of appt location.   ?

## 2021-10-18 ENCOUNTER — Inpatient Hospital Stay: Payer: BC Managed Care – PPO

## 2021-10-18 ENCOUNTER — Inpatient Hospital Stay: Payer: BC Managed Care – PPO | Attending: Genetic Counselor | Admitting: Genetic Counselor

## 2021-10-18 DIAGNOSIS — Z800 Family history of malignant neoplasm of digestive organs: Secondary | ICD-10-CM

## 2021-10-18 DIAGNOSIS — Z860100 Personal history of colon polyps, unspecified: Secondary | ICD-10-CM

## 2021-10-18 LAB — GENETIC SCREENING ORDER

## 2021-10-19 ENCOUNTER — Encounter: Payer: Self-pay | Admitting: Genetic Counselor

## 2021-10-19 DIAGNOSIS — Z860100 Personal history of colon polyps, unspecified: Secondary | ICD-10-CM | POA: Insufficient documentation

## 2021-10-19 NOTE — Progress Notes (Signed)
REFERRING PROVIDER: Rogene Houston, MD [address], [place],  [place] [address]  PRIMARY PROVIDER:  Bremen Nation, MD  PRIMARY REASON FOR VISIT:  1. Family history of colon cancer   2. History of colonic polyps     HISTORY OF PRESENT ILLNESS:   [name], a 46 y.o. female, was seen for a [place] cancer genetics consultation at the request of Dr. Laural Golden due to a personal history of colon polyps.  [name] presents to clinic today to discuss the possibility of a hereditary predisposition to polyposis/cancer, to discuss genetic testing, and to further clarify her future cancer risks, as well as potential cancer risks for family members.   [name] has a history of 9 colon polyps noted on her 2023 colonoscopy. 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   CANCER HISTORY:  Oncology History   No history exists.    RISK FACTORS:  First live birth at age 84.  OCP use for approximately  20+  years.  Ovaries intact: yes.  Uterus intact: yes.  Menopausal status: premenopausal.  HRT use: 0 years. Colonoscopy: yes;  9 colon polyps . Mammogram within the last year: yes. Number of breast biopsies: 0. Up to date with pelvic exams: yes. Any excessive radiation exposure in the past: no  Past Medical History:  Diagnosis Date   Anxiety    Contraceptive management 07/20/2014   Menstrual cramps 07/20/2014   PMS (premenstrual syndrome) 07/20/2014   Skin cyst 03/10/2013   Right clavicle area very mobile    Past Surgical History:  Procedure Laterality Date   COLONOSCOPY WITH PROPOFOL N/A 09/13/2021   Procedure: COLONOSCOPY WITH PROPOFOL;  Surgeon: Rogene Houston, MD;  Location: [facility];  Service: Endoscopy;  Laterality: N/A;  910   FOOT SURGERY Right    HEMOSTASIS CLIP PLACEMENT  09/13/2021   Procedure: HEMOSTASIS CLIP PLACEMENT;  Surgeon: Rogene Houston, MD;  Location: [facility];  Service: Endoscopy;;   HOT HEMOSTASIS  09/13/2021    Procedure: HOT HEMOSTASIS (ARGON PLASMA COAGULATION/BICAP);  Surgeon: Rogene Houston, MD;  Location: [facility];  Service: Endoscopy;;   POLYPECTOMY  09/13/2021   Procedure: POLYPECTOMY;  Surgeon: Rogene Houston, MD;  Location: [facility];  Service: Endoscopy;;   SUBMUCOSAL LIFTING INJECTION  09/13/2021   Procedure: SUBMUCOSAL LIFTING INJECTION;  Surgeon: Rogene Houston, MD;  Location: [facility];  Service: Endoscopy;;    Social History   Socioeconomic History   Marital status: Married    Spouse name: Not on file   Number of children: 3   Years of education: Not on file   Highest education level: Not on file  Occupational History   Not on file  Tobacco Use   Smoking status: Never   Smokeless tobacco: Never  Vaping Use   Vaping Use: Never used  Substance and Sexual Activity   Alcohol use: Yes    Comment: few times a week   Drug use: No   Sexual activity: Yes    Birth control/protection: Other-see comments    Comment: Husband on HRT  Other Topics Concern   Not on file  Social History Narrative   Not on file   Social Determinants of Health   Financial Resource Strain: Low Risk    Difficulty of Paying Living Expenses: Not hard at all  Food Insecurity: No Food Insecurity   Worried About Estate manager/land agent of Food in the Last Year: Never true  Ran Out of Food in the Last Year: Never true  Transportation Needs: No Transportation Needs   Lack of Transportation (Medical): No   Lack of Transportation (Non-Medical): No  Physical Activity: Sufficiently Active   Days of Exercise per Week: 5 days   Minutes of Exercise per Session: 40 min  Stress: No Stress Concern Present   Feeling of Stress : Not at all  Social Connections: Socially Integrated   Frequency of Communication with Friends and Family: More than three times a week   Frequency of Social Gatherings with Friends and Family: Three times a week   Attends Religious Services: 1 to 4 times per year   Active Member of  Clubs or Organizations: No   Attends Music therapist: 1 to 4 times per year   Marital Status: Married     FAMILY HISTORY:  We obtained a detailed, 4-generation family history.  Significant diagnoses are listed below: Family History  Problem Relation Age of Onset   Fibromyalgia Mother    Hypertension Mother    Colon polyps Father    Heart disease Maternal Grandmother    Heart disease Maternal Grandfather    Colon cancer Paternal Grandmother        dx. 73s     Ms. Wishon's father has a history of benign colon polyps. Her paternal grandmother was diagnosed with colon cancer in her 30s, she died at age 8 due to metastatic colon cancer. Ms. Manolis is unaware of previous family history of genetic testing for hereditary cancer risks. There is no reported Ashkenazi Jewish ancestry.  GENETIC COUNSELING ASSESSMENT: [name] is a 46 y.o. female with a personal history of colon polyps which is somewhat suggestive of a hereditary predisposition to polyposis. We, therefore, discussed and recommended the following at today's visit.   DISCUSSION: We discussed that 5 - 10% of polyposis is hereditary, with most cases of polyposis associated with APC and MUTYH.  There are other genes that can be associated with hereditary polyposis syndromes.  We discussed that testing is beneficial for several reasons including knowing how to screen individuals and understanding if other family members could be at risk for cancer and allowing them to undergo genetic testing.   We reviewed the characteristics, features and inheritance patterns of hereditary polyposis/cancer syndromes. We also discussed genetic testing, including the appropriate family members to test, the process of testing, insurance coverage and turn-around-time for results. We discussed the implications of a negative, positive, carrier and/or variant of uncertain significant result.   [name]  was offered a common hereditary cancer  panel (36 genes) and an expanded pan-cancer panel (77 genes). [name] was informed of the benefits and limitations of each panel, including that expanded pan-cancer panels contain genes that do not have clear management guidelines at this point in time.  We also discussed that as the number of genes included on a panel increases, the chances of variants of uncertain significance increases.  After considering the benefits and limitations of each gene panel, Ms. Harper elected to have [employer].   The CancerNext gene panel offered by [employer] includes sequencing, rearrangement analysis, and RNA analysis for the following 36 genes:   APC, ATM, AXIN2, BARD1, BMPR1A, BRCA1, BRCA2, BRIP1, CDH1, CDK4, CDKN2A, CHEK2, DICER1, HOXB13, EPCAM, GREM1, MLH1, MSH2, MSH3, MSH6, MUTYH, NBN, NF1, NTHL1, PALB2, PMS2, POLD1, POLE, PTEN, RAD51C, RAD51D, RECQL, SMAD4, SMARCA4, STK11, and TP53.   Based on Ms. Steffensen's personal history of colon polyps, she does  not quite meet NCCN criteria for genetic testing (individuals with 10 or more adenomas are recommended to have genetic testing and Ms. Molner has had 9 polyps). However, she has 9 adenomas at a young age. Therefore, genetic testing may be considered. We discussed that if her out of pocket cost for testing is over $100, the laboratory will call and confirm whether she wants to proceed with testing.  If the out of pocket cost of testing is less than $100 she will be billed by the genetic testing laboratory.   PLAN: After considering the risks, benefits, and limitations, Ms. Frutoso Chase provided informed consent to pursue genetic testing and the blood sample was sent to [facility] for analysis of the CancerNext Panel. Results should be available within approximately 2-3 weeks' time, at which point they will be disclosed by telephone to Ms. Mandt, as will any additional recommendations warranted by these results. Ms. Bonito will receive a summary  of her genetic counseling visit and a copy of her results once available. This information will also be available in Epic.    Ms. Amadon questions were answered to her satisfaction today. Our contact information was provided should additional questions or concerns arise. Thank you for the referral and allowing [place] to share in the care of your patient.   Lucille Passy, MS, [facility] Genetic Counselor [email or web address] (P) [phone]  The patient was seen for a total of 35 minutes in face-to-face genetic counseling. The patient was seen alone.  Drs. Lindi Adie and/or Burr Medico were available to discuss this case as needed.   _______________________________________________________________________ For Office Staff:  Number of people involved in session: 1 Was an Intern/ student involved with case: no

## 2021-11-08 ENCOUNTER — Telehealth: Payer: Self-pay | Admitting: Genetic Counselor

## 2021-11-08 ENCOUNTER — Encounter: Payer: Self-pay | Admitting: Genetic Counselor

## 2021-11-08 DIAGNOSIS — Z1379 Encounter for other screening for genetic and chromosomal anomalies: Secondary | ICD-10-CM | POA: Insufficient documentation

## 2021-11-12 ENCOUNTER — Ambulatory Visit: Payer: Self-pay | Admitting: Genetic Counselor

## 2021-11-12 DIAGNOSIS — Z1379 Encounter for other screening for genetic and chromosomal anomalies: Secondary | ICD-10-CM

## 2021-11-15 ENCOUNTER — Encounter: Payer: Self-pay | Admitting: Genetic Counselor

## 2022-03-20 ENCOUNTER — Other Ambulatory Visit (HOSPITAL_COMMUNITY): Payer: Self-pay | Admitting: Adult Health

## 2022-03-20 DIAGNOSIS — Z1231 Encounter for screening mammogram for malignant neoplasm of breast: Secondary | ICD-10-CM

## 2022-05-06 ENCOUNTER — Ambulatory Visit (HOSPITAL_COMMUNITY)
Admission: RE | Admit: 2022-05-06 | Discharge: 2022-05-06 | Disposition: A | Discharge status: Home / Self Care | Payer: BC Managed Care – PPO | Source: Ambulatory Visit | Attending: Adult Health | Admitting: Adult Health

## 2022-05-06 DIAGNOSIS — Z1231 Encounter for screening mammogram for malignant neoplasm of breast: Secondary | ICD-10-CM | POA: Diagnosis not present

## 2022-06-24 ENCOUNTER — Other Ambulatory Visit: Payer: Self-pay | Admitting: Adult Health

## 2022-07-22 ENCOUNTER — Encounter: Payer: Self-pay | Admitting: Adult Health

## 2022-07-22 ENCOUNTER — Ambulatory Visit (INDEPENDENT_AMBULATORY_CARE_PROVIDER_SITE_OTHER): Payer: BC Managed Care – PPO | Admitting: Adult Health

## 2022-07-22 ENCOUNTER — Other Ambulatory Visit (HOSPITAL_COMMUNITY)
Admission: RE | Admit: 2022-07-22 | Discharge: 2022-07-22 | Disposition: A | Discharge status: Home / Self Care | Payer: BC Managed Care – PPO | Source: Ambulatory Visit | Attending: Adult Health | Admitting: Adult Health

## 2022-07-22 VITALS — BP 136/87 | HR 94 | Ht 65.0 in | Wt 176.0 lb

## 2022-07-22 DIAGNOSIS — Z1339 Encounter for screening examination for other mental health and behavioral disorders: Secondary | ICD-10-CM

## 2022-07-22 DIAGNOSIS — Z1211 Encounter for screening for malignant neoplasm of colon: Secondary | ICD-10-CM | POA: Diagnosis not present

## 2022-07-22 DIAGNOSIS — Z01419 Encounter for gynecological examination (general) (routine) without abnormal findings: Secondary | ICD-10-CM

## 2022-07-22 DIAGNOSIS — N951 Menopausal and female climacteric states: Secondary | ICD-10-CM

## 2022-07-22 DIAGNOSIS — N943 Premenstrual tension syndrome: Secondary | ICD-10-CM | POA: Diagnosis not present

## 2022-07-22 LAB — HEMOCCULT GUIAC POC 1CARD (OFFICE): Fecal Occult Blood, POC: NEGATIVE

## 2022-07-22 NOTE — Progress Notes (Signed)
Patient ID: Ann Campbell, female   DOB: 08/11/1975, 47 y.o.   MRN: [id number] History of Present Illness: Ann Campbell is a 47 year old white female, married, G4P0013 in for a well woman gyn exam and pap. She was being moody, brain fog, had vaginal dryness and not sleeping well and did televisit and  was placed on estradiol vaginal cram and progesterone 100 mg and feels much better.  She works out about 5 x per week. She is self employed. 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  PCP is Dr Jimmye Norman  Current Medications, Allergies, Past Medical History, Past Surgical History, Family History and Social History were reviewed in [facility] record.     Review of Systems: Patient denies any headaches, hearing loss, fatigue, blurred vision, shortness of breath, chest pain, abdominal pain, problems with bowel movements, urination, or intercourse. No joint pain or mood swings.  See HPI for positives.   Physical Exam:BP 136/87 (BP Location: Right Arm, Patient Position: Sitting, Cuff Size: Normal)   Pulse 94   Ht '5\' 5"'$  (1.651 m)   Wt 176 lb (79.8 kg)   LMP 06/27/2022 (Approximate)   BMI 29.29 kg/m   General:  Well developed, well nourished, no acute distress Skin:  Warm and dry Neck:  Midline trachea, normal thyroid, good ROM, no lymphadenopathy Lungs; Clear to auscultation bilaterally Breast:  No dominant palpable mass, retraction, or nipple discharge Cardiovascular: Regular rate and rhythm Abdomen:  Soft, non tender, no hepatosplenomegaly Pelvic:  External genitalia is normal in appearance, no lesions.  The vagina is normal in appearance. Urethra has no lesions or masses. The cervix is bulbous,pap with HR HPV genotyping performed.  Uterus is felt to be normal size, shape, and contour.  No adnexal masses or tenderness noted.Bladder is non tender, no masses felt. Rectal: Good sphincter tone, no polyps, or hemorrhoids felt.  Hemoccult negative. Extremities/musculoskeletal:  No  swelling or varicosities noted, no clubbing or cyanosis Psych:  No mood changes, alert and cooperative,seems happy AA is 3 Fall risk is low    07/22/2022    2:43 PM 07/18/2021   11:02 AM 01/27/2020    1:47 PM  Depression screen PHQ 2/9  Decreased Interest 0 0 0  Down, Depressed, Hopeless 0 0 0  PHQ - 2 Score 0 0 0  Altered sleeping 0 0 0  Tired, decreased energy 0 0 0  Change in appetite 0 0 0  Feeling bad or failure about yourself  0 0 0  Trouble concentrating 0 0 0  Moving slowly or fidgety/restless 0 0 0  Suicidal thoughts 0 0 0  PHQ-9 Score 0 0 0  Difficult doing work/chores   Not difficult at all       07/22/2022    2:43 PM 07/18/2021   11:02 AM 01/27/2020    1:48 PM  GAD 7 : Generalized Anxiety Score  Nervous, Anxious, on Edge 1 0 0  Control/stop worrying 0 0 0  Worry too much - different things 0 0 0  Trouble relaxing 0 0 0  Restless 0 0 0  Easily annoyed or irritable 1 0 1  Afraid - awful might happen 0 0 0  Total GAD 7 Score 2 0 1  Anxiety Difficulty   Not difficult at all      Upstream - 07/22/22 1452       Pregnancy Intention Screening   Does the patient want to become pregnant in the next year? No    Does the  patient's partner want to become pregnant in the next year? No    Would the patient like to discuss contraceptive options today? No      Contraception Wrap Up   Current Method No Method - Other Reason    Reason for No Current Contraceptive Method at Intake ([facility] Only) Other    End Method No Method - Other Reason             Examination chaperoned by Levy Pupa LPN  Impression and Plan: 1. Encounter for gynecological examination with Papanicolaou smear of cervix Pap sent Pap in 3 years if normal Physical in 1 year Negative mammogram 05/06/22 Colonoscopy per GI Stay  active  - Cytology - PAP( [place])  2. Encounter for screening fecal occult blood testing Hemoccult was negative  - POCT occult blood stool  3. Perimenopause Was moody,  had brain fog, not sleeping well and vaginal dryness  On progesterone 100 mg 1 daily now Used estradiol vaginal cream about 2 x a week   4. PMS (premenstrual syndrome) On Zoloft 50 mg 1 daily, has refills

## 2022-07-30 LAB — CYTOLOGY - PAP
Comment: NEGATIVE
Diagnosis: NEGATIVE
High risk HPV: NEGATIVE

## 2022-09-03 DIAGNOSIS — D2262 Melanocytic nevi of left upper limb, including shoulder: Secondary | ICD-10-CM | POA: Diagnosis not present

## 2022-09-03 DIAGNOSIS — L814 Other melanin hyperpigmentation: Secondary | ICD-10-CM | POA: Diagnosis not present

## 2022-09-03 DIAGNOSIS — D225 Melanocytic nevi of trunk: Secondary | ICD-10-CM | POA: Diagnosis not present

## 2022-09-03 DIAGNOSIS — D224 Melanocytic nevi of scalp and neck: Secondary | ICD-10-CM | POA: Diagnosis not present

## 2022-09-12 ENCOUNTER — Encounter (INDEPENDENT_AMBULATORY_CARE_PROVIDER_SITE_OTHER): Payer: Self-pay | Admitting: *Deleted

## 2022-10-04 ENCOUNTER — Telehealth (INDEPENDENT_AMBULATORY_CARE_PROVIDER_SITE_OTHER): Payer: Self-pay | Admitting: Gastroenterology

## 2022-10-04 NOTE — Telephone Encounter (Signed)
Any Room Thanks 

## 2022-10-04 NOTE — Telephone Encounter (Signed)
Who is your primary care physician: Dr.Gordon Mayford Knife  Reasons for the colonoscopy: 1 year Recall  Have you had a colonoscopy before?  Yes 09/13/21  Do you have family history of colon cancer? Yes grandmother  Previous colonoscopy with polyps removed? Yes 09/13/21  Do you have a history colorectal cancer?   no  Are you diabetic? If yes, Type 1 or Type 2?    no  Do you have a prosthetic or mechanical heart valve? no  Do you have a pacemaker/defibrillator?   no  Have you had endocarditis/atrial fibrillation? no  Have you had joint replacement within the last 12 months?  no  Do you tend to be constipated or have to use laxatives? no  Do you have any history of drugs or alchohol?  no  Do you use supplemental oxygen?  no  Have you had a stroke or heart attack within the last 6 months? no  Do you take weight loss medication?  no  For female patients: have you had a hysterectomy?  no                                     are you post menopausal?       no                                            do you still have your menstrual cycle? yes      Do you take any blood-thinning medications such as: (aspirin, warfarin, Plavix, Aggrenox)  no  If yes we need the name, milligram, dosage and who is prescribing doctor  Current Outpatient Medications on File Prior to Visit  Medication Sig Dispense Refill   clindamycin (CLEOCIN T) 1 % SWAB Apply 1 each topically 2 (two) times daily as needed (dermatitis).     EPINEPHrine 0.3 mg/0.3 mL IJ SOAJ injection Inject 0.3 mg into the muscle as needed for anaphylaxis.     ketoconazole (NIZORAL) 2 % shampoo Apply 1 application. topically daily as needed for irritation.     loratadine (CLARITIN) 10 MG tablet Take 10 mg by mouth daily.     Melatonin 5 MG CAPS Take 5 mg by mouth at bedtime as needed (sleep).     Multiple Vitamin (MULTIVITAMIN WITH MINERALS) TABS tablet Take 1 tablet by mouth daily.     Omega-3 Fatty Acids (FISH OIL PO) Take 2 capsules by  mouth daily.     PROGESTERONE PO Take 100 mg by mouth at bedtime.     Red Yeast Rice 600 MG CAPS Take 1,200 mg by mouth daily.     sertraline (ZOLOFT) 50 MG tablet Take 1 tablet by mouth once daily 90 tablet 3   UNABLE TO FIND Estradiol cream-prn     No current facility-administered medications on file prior to visit.    Allergies  Allergen Reactions   Bee Venom Anaphylaxis and Hives   Amoxicillin-Pot Clavulanate     GI Intolerance   Sulfa Antibiotics Rash     Pharmacy: [facility]  Primary Insurance Name: BCBS  Best number where you can be reached: [phone]

## 2022-10-07 MED ORDER — NA SULFATE-K SULFATE-MG SULF 17.5-3.13-1.6 GM/177ML PO SOLN: ORAL | 0 refills | Status: DC

## 2022-10-07 NOTE — Telephone Encounter (Signed)
Left message to return call 

## 2022-10-07 NOTE — Addendum Note (Signed)
Addended by: Marlowe Shores on: 10/07/2022 02:54 PM   Modules accepted: Orders

## 2022-10-07 NOTE — Telephone Encounter (Signed)
Questionnaire from recall, no referral needed  

## 2022-10-07 NOTE — Telephone Encounter (Signed)
Pt returned call and TCS scheduled for 11/13/22 at 10:45am. Prep sent to pharmacy. Pt requested suprep. Instructions sent via my chart and via mail.

## 2022-10-09 DIAGNOSIS — S83242A Other tear of medial meniscus, current injury, left knee, initial encounter: Secondary | ICD-10-CM | POA: Diagnosis not present

## 2022-10-17 ENCOUNTER — Other Ambulatory Visit: Payer: Self-pay

## 2022-10-17 ENCOUNTER — Emergency Department (HOSPITAL_COMMUNITY)
Admission: EM | Admit: 2022-10-17 | Discharge: 2022-10-17 | Disposition: A | Discharge status: Home / Self Care | Payer: BC Managed Care – PPO | Attending: Emergency Medicine | Admitting: Emergency Medicine

## 2022-10-17 ENCOUNTER — Emergency Department (HOSPITAL_COMMUNITY): Payer: BC Managed Care – PPO

## 2022-10-17 ENCOUNTER — Encounter (HOSPITAL_COMMUNITY): Payer: Self-pay

## 2022-10-17 DIAGNOSIS — S61311A Laceration without foreign body of left index finger with damage to nail, initial encounter: Secondary | ICD-10-CM | POA: Diagnosis not present

## 2022-10-17 DIAGNOSIS — S199XXA Unspecified injury of neck, initial encounter: Secondary | ICD-10-CM | POA: Diagnosis not present

## 2022-10-17 DIAGNOSIS — S61412A Laceration without foreign body of left hand, initial encounter: Secondary | ICD-10-CM | POA: Diagnosis not present

## 2022-10-17 DIAGNOSIS — Y9301 Activity, walking, marching and hiking: Secondary | ICD-10-CM | POA: Insufficient documentation

## 2022-10-17 DIAGNOSIS — S61211A Laceration without foreign body of left index finger without damage to nail, initial encounter: Secondary | ICD-10-CM | POA: Diagnosis not present

## 2022-10-17 DIAGNOSIS — S0181XA Laceration without foreign body of other part of head, initial encounter: Secondary | ICD-10-CM | POA: Insufficient documentation

## 2022-10-17 DIAGNOSIS — S01312A Laceration without foreign body of left ear, initial encounter: Secondary | ICD-10-CM | POA: Diagnosis not present

## 2022-10-17 DIAGNOSIS — S6992XA Unspecified injury of left wrist, hand and finger(s), initial encounter: Secondary | ICD-10-CM

## 2022-10-17 DIAGNOSIS — T07XXXA Unspecified multiple injuries, initial encounter: Secondary | ICD-10-CM

## 2022-10-17 DIAGNOSIS — S61452A Open bite of left hand, initial encounter: Secondary | ICD-10-CM | POA: Diagnosis not present

## 2022-10-17 DIAGNOSIS — S0991XA Unspecified injury of ear, initial encounter: Secondary | ICD-10-CM | POA: Diagnosis not present

## 2022-10-17 DIAGNOSIS — S61451A Open bite of right hand, initial encounter: Secondary | ICD-10-CM | POA: Diagnosis not present

## 2022-10-17 DIAGNOSIS — S61411A Laceration without foreign body of right hand, initial encounter: Secondary | ICD-10-CM | POA: Diagnosis not present

## 2022-10-17 DIAGNOSIS — S1181XA Laceration without foreign body of other specified part of neck, initial encounter: Secondary | ICD-10-CM | POA: Diagnosis not present

## 2022-10-17 DIAGNOSIS — W540XXA Bitten by dog, initial encounter: Secondary | ICD-10-CM | POA: Insufficient documentation

## 2022-10-17 DIAGNOSIS — S51011A Laceration without foreign body of right elbow, initial encounter: Secondary | ICD-10-CM | POA: Insufficient documentation

## 2022-10-17 DIAGNOSIS — S51811A Laceration without foreign body of right forearm, initial encounter: Secondary | ICD-10-CM | POA: Insufficient documentation

## 2022-10-17 LAB — CBC WITH DIFFERENTIAL/PLATELET
Abs Immature Granulocytes: 0.04 10*3/uL (ref 0.00–0.07)
Basophils Absolute: 0 10*3/uL (ref 0.0–0.1)
Basophils Relative: 0 %
Eosinophils Absolute: 0.1 10*3/uL (ref 0.0–0.5)
Eosinophils Relative: 1 %
HCT: 40.1 % (ref 36.0–46.0)
Hemoglobin: 13.7 g/dL (ref 12.0–15.0)
Immature Granulocytes: 0 %
Lymphocytes Relative: 21 %
Lymphs Abs: 2.1 10*3/uL (ref 0.7–4.0)
MCH: 31.1 pg (ref 26.0–34.0)
MCHC: 34.2 g/dL (ref 30.0–36.0)
MCV: 90.9 fL (ref 80.0–100.0)
Monocytes Absolute: 0.7 10*3/uL (ref 0.1–1.0)
Monocytes Relative: 7 %
Neutro Abs: 6.9 10*3/uL (ref 1.7–7.7)
Neutrophils Relative %: 71 %
Platelets: 291 10*3/uL (ref 150–400)
RBC: 4.41 MIL/uL (ref 3.87–5.11)
RDW: 12.8 % (ref 11.5–15.5)
WBC: 9.9 10*3/uL (ref 4.0–10.5)
nRBC: 0 % (ref 0.0–0.2)

## 2022-10-17 LAB — BASIC METABOLIC PANEL
Anion gap: 9 (ref 5–15)
BUN: 13 mg/dL (ref 6–20)
CO2: 23 mmol/L (ref 22–32)
Calcium: 9 mg/dL (ref 8.9–10.3)
Chloride: 103 mmol/L (ref 98–111)
Creatinine, Ser: 0.8 mg/dL (ref 0.44–1.00)
GFR, Estimated: >60 mL/min (ref >60)
Glucose, Bld: 130 mg/dL — ABNORMAL HIGH (ref 70–99)
Potassium: 3.1 mmol/L — ABNORMAL LOW (ref 3.5–5.1)
Sodium: 135 mmol/L (ref 135–145)

## 2022-10-17 MED ORDER — ONDANSETRON HCL 4 MG PO TABS: 4.0000 mg | ORAL_TABLET | Freq: Four times a day (QID) | ORAL | 0 refills | Status: DC

## 2022-10-17 MED ORDER — IOHEXOL 350 MG/ML SOLN
75.0000 mL | Freq: Once | INTRAVENOUS | Status: AC | PRN
  Administered 2022-10-17: 75 mL via INTRAVENOUS

## 2022-10-17 MED ORDER — CEFAZOLIN SODIUM-DEXTROSE 1-4 GM/50ML-% IV SOLN: 1.0000 g | Freq: Once | INTRAVENOUS | Status: DC

## 2022-10-17 MED ORDER — SODIUM CHLORIDE 0.9 % IV SOLN
3.0000 g | Freq: Once | INTRAVENOUS | Status: AC
  Administered 2022-10-17: 3 g via INTRAVENOUS
  Filled 2022-10-17: qty 8

## 2022-10-17 MED ORDER — LIDOCAINE-EPINEPHRINE (PF) 2 %-1:200000 IJ SOLN
INTRAMUSCULAR | Status: AC
  Filled 2022-10-17: qty 20

## 2022-10-17 MED ORDER — ONDANSETRON HCL 4 MG/2ML IJ SOLN
4.0000 mg | Freq: Once | INTRAMUSCULAR | Status: AC
  Administered 2022-10-17: 4 mg via INTRAVENOUS
  Filled 2022-10-17: qty 2

## 2022-10-17 MED ORDER — FENTANYL CITRATE PF 50 MCG/ML IJ SOSY
25.0000 ug | PREFILLED_SYRINGE | Freq: Once | INTRAMUSCULAR | Status: AC
  Administered 2022-10-17: 25 ug via INTRAVENOUS
  Filled 2022-10-17: qty 1

## 2022-10-17 MED ORDER — FENTANYL CITRATE PF 50 MCG/ML IJ SOSY
50.0000 ug | PREFILLED_SYRINGE | Freq: Once | INTRAMUSCULAR | Status: AC
  Administered 2022-10-17: 50 ug via INTRAVENOUS
  Filled 2022-10-17: qty 1

## 2022-10-17 MED ORDER — AMOXICILLIN-POT CLAVULANATE 875-125 MG PO TABS: 1.0000 | ORAL_TABLET | Freq: Two times a day (BID) | ORAL | 0 refills | Status: DC

## 2022-10-17 MED ORDER — SODIUM CHLORIDE 0.9 % IV BOLUS
500.0000 mL | Freq: Once | INTRAVENOUS | Status: AC
Start: 2022-10-17 — End: 2022-10-17
  Administered 2022-10-17: 500 mL via INTRAVENOUS

## 2022-10-17 MED ORDER — LIDOCAINE HCL (PF) 2 % IJ SOLN
INTRAMUSCULAR | Status: AC
  Filled 2022-10-17: qty 15

## 2022-10-17 MED ORDER — LIDOCAINE-EPINEPHRINE (PF) 2 %-1:200000 IJ SOLN
20.0000 mL | Freq: Once | INTRAMUSCULAR | Status: AC
  Administered 2022-10-17: 20 mL
  Filled 2022-10-17: qty 20

## 2022-10-17 NOTE — ED Provider Notes (Addendum)
[place] EMERGENCY DEPARTMENT AT [facility] Provider Note   CSN: [id number] Arrival date & time: 10/17/22  4098     History  Chief Complaint  Patient presents with   Animal Bite    Ann Campbell is a 47 y.o. female.   Animal Bite Associated symptoms: no fever and no numbness         Ann Campbell is a 47 y.o. female who presents to the Emergency Department complaining of multiple dog bites including 2 lacerations to the dorsal left hand, left face, right forearm, and anterior neck.  She also has an avulsion at base of fingernail of the left index finger.  States that she was walking this morning and the neighbors dog broke from his leash and attacked her.  Incident occurred in [facility], animal control was notified and here to take report. She has some pain with movement of her neck, denies swelling of her neck and difficulty swallowing.  Denies numbness and tingling of her right arm and left hand.  Some pain with movement of the fingers of the left hand.  Td is up-to-date   Home Medications Prior to Admission medications   Medication Sig Start Date End Date Taking? Authorizing Provider  clindamycin (CLEOCIN T) 1 % SWAB Apply 1 each topically 2 (two) times daily as needed (dermatitis). 08/11/21   [provider]  EPINEPHrine 0.3 mg/0.3 mL IJ SOAJ injection Inject 0.3 mg into the muscle as needed for anaphylaxis.    [provider]  ketoconazole (NIZORAL) 2 % shampoo Apply 1 application. topically daily as needed for irritation. 07/17/21   [provider]  loratadine (CLARITIN) 10 MG tablet Take 10 mg by mouth daily.    [provider]  Melatonin 5 MG CAPS Take 5 mg by mouth at bedtime as needed (sleep).    [provider]  Multiple Vitamin (MULTIVITAMIN WITH MINERALS) TABS tablet Take 1 tablet by mouth daily.    [provider]  Na Sulfate-K Sulfate-Mg Sulf 17.5-3.13-1.6 GM/177ML SOLN Use as directed 10/07/22    Marguerita Merles, Reuel Boom, MD  Omega-3 Fatty Acids (FISH OIL PO) Take 2 capsules by mouth daily.    [provider]  PROGESTERONE PO Take 100 mg by mouth at bedtime.    [provider]  Red Yeast Rice 600 MG CAPS Take 1,200 mg by mouth daily.    [provider]  sertraline (ZOLOFT) 50 MG tablet Take 1 tablet by mouth once daily 06/24/22   Adline Potter, NP  UNABLE TO FIND Estradiol cream-prn    [provider]      Allergies    Bee venom, Amoxicillin-pot clavulanate, and Sulfa antibiotics    Review of Systems   Review of Systems  Constitutional:  Negative for chills and fever.  Respiratory:  Negative for shortness of breath.   Cardiovascular:  Negative for chest pain.  Gastrointestinal:  Negative for abdominal pain, nausea and vomiting.  Musculoskeletal:  Positive for neck pain. Negative for arthralgias.  Skin:  Positive for wound.       Multiple lacerations from dog bites.  Lacerations to dorsal left hand, left face, right forearm, anterior neck  Neurological:  Negative for dizziness, syncope, weakness, numbness and headaches.  Psychiatric/Behavioral:  Negative for confusion.     Physical Exam Updated Vital Signs BP (!) 125/95   Pulse 90   Temp 97.6 F (36.4 C) (Oral)   Resp 18   Ht 5\' 5"  (1.651 m)   Wt  78.9 kg   LMP 10/10/2022 (Exact Date)   SpO2 99%   BMI 28.95 kg/m  Physical Exam Vitals and nursing note reviewed.  Constitutional:      Comments: Patient is tearful and anxious appearing  HENT:     Mouth/Throat:     Mouth: Mucous membranes are moist.     Pharynx: Oropharynx is clear.  Eyes:     Extraocular Movements: Extraocular movements intact.     Conjunctiva/sclera: Conjunctivae normal.     Pupils: Pupils are equal, round, and reactive to light.  Neck:     Comments: 4 cm laceration anterior to lateral left neck.  Exposed adipose tissue, no active bleeding or surrounding hematoma. Cardiovascular:     Rate and Rhythm:  Normal rate and regular rhythm.     Pulses: Normal pulses.  Pulmonary:     Effort: Pulmonary effort is normal. No respiratory distress.     Breath sounds: Normal breath sounds.  Musculoskeletal:     Cervical back: Tenderness present. Pain with movement present.     Comments: Base of the fingernail of the left index finger is avulsed.  Skin:    General: Skin is warm.     Capillary Refill: Capillary refill takes less than 2 seconds.     Comments: 2 lacerations dorsal left hand, laceration left earlobe, large laceration right proximal forearm.   see attached photos  Neurological:     General: No focal deficit present.     Mental Status: She is alert.            ED Results / Procedures / Treatments   Labs (all labs ordered are listed, but only abnormal results are displayed) Labs Reviewed  BASIC METABOLIC PANEL - Abnormal; Notable for the following components:      Result Value   Potassium 3.1 (*)    Glucose, Bld 130 (*)    All other components within normal limits  CBC WITH DIFFERENTIAL/PLATELET    EKG None  Radiology DG Elbow Complete Right  Result Date: 10/17/2022 CLINICAL DATA:  Dog bite EXAM: RIGHT ELBOW - COMPLETE 3+ VIEW COMPARISON:  None Available. FINDINGS: There is lucency in the soft tissues along the volar aspect of the forearm and about the elbow likely related to the dog bite. There is no acute fracture or dislocation. There is no radiopaque foreign body. IMPRESSION: Lucency in the soft tissues along the volar aspect of the forearm and about the elbow likely related to the dog bite. No radiopaque foreign body or acute fracture. Electronically Signed   By: Lesia Hausen M.D.   On: 10/17/2022 10:31   DG Hand Complete Left  Result Date: 10/17/2022 CLINICAL DATA:  Dog bites EXAM: LEFT HAND - COMPLETE 3+ VIEW COMPARISON:  None Available. FINDINGS: There is an acute nondisplaced transverse fracture through the distal tuft of the index finger with surrounding lucency  and possible nailbed injury. There is no other acute fracture. There is additional lucency in the interspace between the middle and ring fingers in the ring and little fingers which may be related to the reported dog bite. Bony alignment is normal. The joint spaces are preserved. There is no erosive or destructive change. There is no radiopaque foreign body. IMPRESSION: 1. Acute nondisplaced transverse fracture of the distal tuft of the index finger with surrounding lucency and possible nailbed injury. 2. Additional lucency in the interspaces between the middle and ring fingers in the ring and little fingers which may be related to the reported dog  bite. No radiopaque foreign body. Electronically Signed   By: Lesia Hausen M.D.   On: 10/17/2022 10:24    Procedures Procedures    Medications Ordered in ED Medications  lidocaine HCl (PF) (XYLOCAINE) 2 % injection (has no administration in time range)  Ampicillin-Sulbactam (UNASYN) 3 g in sodium chloride 0.9 % 100 mL IVPB (has no administration in time range)  sodium chloride 0.9 % bolus 500 mL (500 mLs Intravenous New Bag/Given 10/17/22 0956)  ondansetron (ZOFRAN) injection 4 mg (4 mg Intravenous Given 10/17/22 0958)  fentaNYL (SUBLIMAZE) injection 25 mcg (25 mcg Intravenous Given 10/17/22 1610)    ED Course/ Medical Decision Making/ A&P                             Medical Decision Making Patient here with multiple lacerations secondary to dog bite.  Unprovoked attack, occurred in [facility], animal control here to file report.  Dog is known by patient and is a Psychologist, sport and exercise.  Animal control to verify rabies vaccination status.  Patient's Td up-to-date  There is a laceration to the left neck, bleeding is controlled, do not appreciate any surrounding hematomas.  Airway patent and patient is swallowing without difficulty.  Differential would include but not limited to airway compromise, arterial bleeding, tendon injury, foreign body, high  risk of infection secondary to bite wounds.  Amount and/or Complexity of Data Reviewed Labs: ordered.    Details: Labs interpreted by me no evidence of leukocytosis, hemoglobin unremarkable.  Chemistries show mild hypokalemia Radiology: ordered.    Details: X-ray of the left hand shows acute nondisplaced transverse fracture of the distal tuft of the index finger and nailbed injury.  No radiopaque foreign body  X-ray of the right elbow without evidence of fracture or radiopaque foreign body Discussion of management or test interpretation with external provider(s): Patient here with multiple lacerations including the neck, she needs CT imaging of the neck for further evaluation of possible arterial or airway injury, unfortunately our CT scanner is currently down for repair.  Plain film x-rays of the hand shows a transverse fracture of the distal tuft of the index finger and right elbow without foreign body or fracture  Patient will need transport to [facility] for CT imaging, consulted ER physician, Dr. Theresia Lo who accepts patient for transfer to [facility] ED  Pt also seen by ER attending here, Dr. Estell Harpin and care plan discussed.  Risk Prescription drug management.      Final Clinical Impression(s) / ED Diagnoses Final diagnoses:  Dog bite, initial encounter    Rx / DC Orders ED Discharge Orders     None         Pauline Aus, PA-C 10/17/22 1048    Pauline Aus, PA-C 10/17/22 1053    Bethann Berkshire, MD 10/20/22 1055

## 2022-10-17 NOTE — Discharge Instructions (Addendum)
Keep wounds clean and dry for the next 24 hours, after that I would like for you to remove dressings and wash wounds with warm soap and water once daily.  Monitor closely for signs of infection such as redness, swelling, increasing pain or puslike drainage.  To help prevent infection you should take prescribed Augmentin twice daily for the next 7 days.  This medication can cause upset stomach, be sure to take with food on your stomach.  I sent you in a prescription for Zofran just in case this medication causes nausea.  You can use ibuprofen 600 mg and Tylenol 650 mg every 6 hours to help with pain and swelling.  You can also apply ice to wounds to help with swelling.  Your index finger had a nailbed injury and a fracture at the tip of the finger.  You will need to keep this in the splint until you follow-up with Dr. Izora Ribas. Please call his office tomorrow morning to set up a follow-up appointment.  Your earlobe was repaired with absorbable sutures, do not wear earrings until this has completely healed and sutures have fallen out on their own.  The rest of your sutures will need to be removed in about 10 days.  Follow-up sooner if you note any signs of infection.

## 2022-10-17 NOTE — ED Triage Notes (Signed)
Pt comes in from being bit by a pit bull dog while on a walk this morning. Pt has bite to left jaw, earlobe, two fingers, left hand.

## 2022-10-17 NOTE — ED Provider Notes (Signed)
Patient transferred from [facility] for CTA of the neck after dog bite due to their CT scanner being down.  Please see note from [place] [facility] for full details, but in brief, Ann Campbell is a 47 y.o. female presents after she was attacked by a pit bull while walking.  She sustained bites to the left hand, right elbow, neck and earlobe.  Bleeding from all wounds is controlled.  This is already been reported to animal control and the dog has been detained.  They will obtain information on dog's rabies vaccination status.  Patient's tetanus vaccine is up-to-date.  X-rays of the left hand and right elbow obtained at [facility], distal tuft fracture of the left index finger noted, no foreign bodies, no fracture or foreign body over the right elbow.  Patient sent for CTA given 2 lacerations over the neck to rule out vascular injury or hematoma.  Lacerations were not repaired prior to transfer.  BP (!) 128/92 (BP Location: Left Arm)   Pulse 70   Temp 98.2 F (36.8 C) (Oral)   Resp 17   Ht 5\' 5"  (1.651 m)   Wt 78.9 kg   LMP 10/10/2022 (Exact Date)   SpO2 100%   BMI 28.95 kg/m     Procedures  .Marland KitchenLaceration Repair  Date/Time: 10/17/2022 4:11 PM  Performed by: Dartha Lodge, PA-C Authorized by: Dartha Lodge, PA-C   Consent:    Consent obtained:  Verbal   Consent given by:  Patient   Risks, benefits, and alternatives were discussed: yes     Risks discussed:  Infection, pain, need for additional repair and poor cosmetic result   Alternatives discussed:  No treatment Universal protocol:    Procedure explained and questions answered to patient or proxy's satisfaction: yes     Patient identity confirmed:  Verbally with patient Anesthesia:    Anesthesia method:  Local infiltration   Local anesthetic:  Lidocaine 2% WITH epi Laceration details:    Location:  Ear   Ear location:  L ear (stellate laceration of lower ear lobe wheere it attaches to the face)   Length (cm):   1 Pre-procedure details:    Preparation:  Patient was prepped and draped in usual sterile fashion Exploration:    Hemostasis achieved with:  Epinephrine   Wound exploration: wound explored through full range of motion     Wound extent: areolar tissue violated   Treatment:    Area cleansed with:  Saline   Amount of cleaning:  Standard   Irrigation solution:  Sterile saline   Irrigation volume:  500 ml Skin repair:    Repair method:  Sutures   Suture size:  5-0   Suture material:  Chromic gut   Suture technique:  Simple interrupted   Number of sutures:  4 Approximation:    Approximation:  Close Repair type:    Repair type:  Intermediate Post-procedure details:    Dressing:  Adhesive bandage   Procedure completion:  Tolerated well, no immediate complications .Marland KitchenLaceration Repair  Date/Time: 10/17/2022 4:13 PM  Performed by: Dartha Lodge, PA-C Authorized by: Dartha Lodge, PA-C   Consent:    Consent obtained:  Verbal   Consent given by:  Patient   Risks, benefits, and alternatives were discussed: yes     Risks discussed:  Infection, pain, poor cosmetic result and need for additional repair   Alternatives discussed:  No treatment Universal protocol:    Procedure explained and questions answered to patient  or proxy's satisfaction: yes     Patient identity confirmed:  Verbally with patient Anesthesia:    Anesthesia method:  Local infiltration   Local anesthetic:  Lidocaine 2% WITH epi Laceration details:    Location:  Neck   Neck location:  L anterior   Length (cm):  4 Pre-procedure details:    Preparation:  Patient was prepped and draped in usual sterile fashion and imaging obtained to evaluate for foreign bodies Exploration:    Limited defect created (wound extended): yes     Hemostasis achieved with:  Direct pressure and epinephrine   Imaging outcome: foreign body not noted     Wound exploration: wound explored through full range of motion     Wound extent: areolar  tissue violated   Treatment:    Area cleansed with:  Saline   Amount of cleaning:  Standard   Irrigation solution:  Sterile saline   Irrigation volume:  500 ml   Irrigation method:  Pressure wash Skin repair:    Repair method:  Sutures   Suture size:  5-0   Suture material:  Prolene   Suture technique:  Simple interrupted   Number of sutures:  2 Approximation:    Approximation:  Close Repair type:    Repair type:  Intermediate Post-procedure details:    Dressing:  Non-adherent dressing   Procedure completion:  Tolerated well, no immediate complications .Marland KitchenLaceration Repair  Date/Time: 10/17/2022 4:15 PM  Performed by: Dartha Lodge, PA-C Authorized by: Dartha Lodge, PA-C   Consent:    Consent obtained:  Verbal   Consent given by:  Patient   Risks, benefits, and alternatives were discussed: yes     Risks discussed:  Infection, pain, poor cosmetic result and need for additional repair   Alternatives discussed:  No treatment Universal protocol:    Procedure explained and questions answered to patient or proxy's satisfaction: yes     Patient identity confirmed:  Verbally with patient Anesthesia:    Anesthesia method:  Local infiltration   Local anesthetic:  Lidocaine 2% WITH epi Laceration details:    Location:  Neck   Neck location: midline.   Length (cm):  5 Pre-procedure details:    Preparation:  Patient was prepped and draped in usual sterile fashion and imaging obtained to evaluate for foreign bodies Exploration:    Hemostasis achieved with:  Direct pressure and epinephrine   Imaging outcome: foreign body not noted     Wound extent: areolar tissue violated     Wound extent: no vascular damage   Treatment:    Area cleansed with:  Saline   Irrigation solution:  Sterile saline   Irrigation volume:  500   Irrigation method:  Pressure wash .Marland KitchenLaceration Repair  Date/Time: 10/17/2022 4:24 PM  Performed by: Dartha Lodge, PA-C Authorized by: Dartha Lodge, PA-C    Consent:    Consent obtained:  Verbal   Consent given by:  Patient   Risks, benefits, and alternatives were discussed: yes     Risks discussed:  Infection, pain, need for additional repair and poor cosmetic result   Alternatives discussed:  No treatment Universal protocol:    Procedure explained and questions answered to patient or proxy's satisfaction: yes     Patient identity confirmed:  Verbally with patient Anesthesia:    Anesthesia method:  Local infiltration   Local anesthetic:  Lidocaine 2% WITH epi Laceration details:    Location:  Shoulder/arm   Shoulder/arm location:  R elbow  Length (cm):  8 Pre-procedure details:    Preparation:  Patient was prepped and draped in usual sterile fashion and imaging obtained to evaluate for foreign bodies Exploration:    Hemostasis achieved with:  Epinephrine and direct pressure   Imaging outcome: foreign body not noted     Wound exploration: wound explored through full range of motion and entire depth of wound visualized     Wound extent: areolar tissue violated     Wound extent: no foreign body and no underlying fracture   Treatment:    Area cleansed with:  Saline (saline + hydrogen peroxide)   Amount of cleaning:  Standard   Irrigation solution:  Sterile saline   Irrigation volume:  1L   Irrigation method:  Pressure wash Skin repair:    Repair method:  Sutures   Suture size:  5-0   Suture material:  Prolene   Suture technique:  Simple interrupted   Number of sutures:  5 Approximation:    Approximation:  Loose Repair type:    Repair type:  Intermediate Post-procedure details:    Dressing:  Non-adherent dressing   Procedure completion:  Tolerated well, no immediate complications .Marland KitchenLaceration Repair  Date/Time: 10/17/2022 4:26 PM  Performed by: Dartha Lodge, PA-C Authorized by: Dartha Lodge, PA-C   Consent:    Consent obtained:  Verbal   Consent given by:  Patient   Risks, benefits, and alternatives were discussed:  yes     Risks discussed:  Infection, pain, poor cosmetic result and need for additional repair   Alternatives discussed:  No treatment Universal protocol:    Procedure explained and questions answered to patient or proxy's satisfaction: yes     Patient identity confirmed:  Verbally with patient Anesthesia:    Anesthesia method:  Local infiltration Laceration details:    Location:  Hand   Hand location:  L hand, dorsum   Length (cm):  3 Pre-procedure details:    Preparation:  Patient was prepped and draped in usual sterile fashion Exploration:    Imaging obtained: x-ray     Imaging outcome: foreign body not noted     Wound extent: areolar tissue violated     Wound extent: no foreign body and no underlying fracture   Treatment:    Area cleansed with:  Saline   Amount of cleaning:  Standard   Irrigation solution:  Sterile saline   Irrigation volume:  1L   Irrigation method:  Pressure wash Skin repair:    Repair method:  Sutures   Suture size:  5-0   Suture material:  Prolene   Suture technique:  Simple interrupted   Number of sutures:  2 Approximation:    Approximation:  Close Repair type:    Repair type:  Intermediate Post-procedure details:    Dressing:  Non-adherent dressing   Procedure completion:  Tolerated well, no immediate complications .Marland KitchenLaceration Repair  Date/Time: 10/17/2022 4:28 PM  Performed by: Dartha Lodge, PA-C Authorized by: Dartha Lodge, PA-C   Consent:    Consent obtained:  Verbal   Consent given by:  Patient   Risks, benefits, and alternatives were discussed: yes     Risks discussed:  Infection, pain, poor cosmetic result and need for additional repair   Alternatives discussed:  No treatment Universal protocol:    Procedure explained and questions answered to patient or proxy's satisfaction: yes     Patient identity confirmed:  Verbally with patient Anesthesia:    Anesthesia method:  Local infiltration  Local anesthetic:  Lidocaine 2% WITH  epi Laceration details:    Location:  Hand   Hand location:  L hand, dorsum   Length (cm):  3 Pre-procedure details:    Preparation:  Patient was prepped and draped in usual sterile fashion and imaging obtained to evaluate for foreign bodies Exploration:    Hemostasis achieved with:  Epinephrine and direct pressure   Imaging obtained: x-ray     Imaging outcome: foreign body not noted     Wound exploration: wound explored through full range of motion and entire depth of wound visualized     Wound extent: areolar tissue violated     Wound extent: no foreign body and no underlying fracture   Treatment:    Area cleansed with:  Saline   Amount of cleaning:  Extensive   Irrigation solution:  Sterile saline   Irrigation volume:  1L   Irrigation method:  Pressure wash Skin repair:    Repair method:  Sutures   Suture size:  5-0   Suture material:  Prolene   Suture technique:  Simple interrupted   Number of sutures:  2 Approximation:    Approximation:  Loose Repair type:    Repair type:  Intermediate Post-procedure details:    Dressing:  Non-adherent dressing   Procedure completion:  Tolerated well, no immediate complications .Nail Removal  Date/Time: 10/17/2022 4:30 PM  Performed by: Dartha Lodge, PA-C Authorized by: Dartha Lodge, PA-C   Consent:    Consent obtained:  Verbal   Consent given by:  Patient   Risks, benefits, and alternatives were discussed: yes     Risks discussed:  Bleeding, incomplete removal, infection, pain and permanent nail deformity   Alternatives discussed:  No treatment Universal protocol:    Procedure explained and questions answered to patient or proxy's satisfaction: yes     Patient identity confirmed:  Verbally with patient Location:    Hand:  L index finger Pre-procedure details:    Skin preparation:  Chlorhexidine Anesthesia:    Anesthesia method:  Nerve block   Block location:  Base of left index finger   Block needle gauge:  25 G    Block anesthetic:  Lidocaine 1% w/o epi   Block injection procedure:  Anatomic landmarks identified, incremental injection, negative aspiration for blood and introduced needle   Block outcome:  Anesthesia achieved Nail Removal:    Nail removed:  Complete   Nail bed repaired: yes     Nail bed repair material:  6-0 chromic gut   Number of sutures:  2   Removed nail replaced and anchored: yes   Post-procedure details:    Dressing:  Xeroform gauze and splint   Procedure completion:  Tolerated well, no immediate complications   ED Course / MDM    Medical Decision Making Amount and/or Complexity of Data Reviewed Labs: ordered. Radiology: ordered.  Risk Prescription drug management.   CTA without evidence of hematoma, vascular injury or airway injury.   All lacerations were loosely approximated.  Left index finger with nailbed injury, nail was removed and laceration repaired and the nail was replaced and sutured in place.  Underlying distal tuft fracture present.  Patient already received a dose of IV antibiotics.  PM Dale Collins on-call for hand surgery was consulted and agrees with management plan here in the ED, patient should follow-up with Dr. Izora Ribas hand surgery as an outpatient.  All wounds were dressed.  Patient given extensive education on wound care and infection return  precautions.  Given prescription for Augmentin and discussed appropriate pain management.  At this time she is stable for discharge home.       Dartha Lodge, PA-C 10/17/22 1647    Elayne Snare K, DO 10/22/22 [id number]

## 2022-10-17 NOTE — ED Triage Notes (Addendum)
Pt coming from [facility] for a CT scan. Pt was walking this morning when a dog attacked her. Pt has lac to her left hand, left neck and both arms. 25 mcg fentanyl and 4 mg zofran and unison given PTA  Bp 130/95 HR 72 100% room air

## 2022-10-21 DIAGNOSIS — S1181XD Laceration without foreign body of other specified part of neck, subsequent encounter: Secondary | ICD-10-CM | POA: Diagnosis not present

## 2022-10-21 DIAGNOSIS — S62661A Nondisplaced fracture of distal phalanx of left index finger, initial encounter for closed fracture: Secondary | ICD-10-CM | POA: Diagnosis not present

## 2022-10-23 DIAGNOSIS — S1181XD Laceration without foreign body of other specified part of neck, subsequent encounter: Secondary | ICD-10-CM | POA: Diagnosis not present

## 2022-10-30 DIAGNOSIS — S61512D Laceration without foreign body of left wrist, subsequent encounter: Secondary | ICD-10-CM | POA: Diagnosis not present

## 2022-10-30 DIAGNOSIS — S61411D Laceration without foreign body of right hand, subsequent encounter: Secondary | ICD-10-CM | POA: Diagnosis not present

## 2022-11-12 ENCOUNTER — Other Ambulatory Visit (INDEPENDENT_AMBULATORY_CARE_PROVIDER_SITE_OTHER): Payer: Self-pay | Admitting: Gastroenterology

## 2022-11-12 DIAGNOSIS — Z1211 Encounter for screening for malignant neoplasm of colon: Secondary | ICD-10-CM

## 2022-11-13 ENCOUNTER — Ambulatory Visit (HOSPITAL_COMMUNITY): Payer: BC Managed Care – PPO | Admitting: Certified Registered Nurse Anesthetist

## 2022-11-13 ENCOUNTER — Encounter (HOSPITAL_COMMUNITY)
Admission: RE | Disposition: A | Discharge status: Home / Self Care | Payer: Self-pay | Source: Ambulatory Visit | Attending: Gastroenterology

## 2022-11-13 ENCOUNTER — Other Ambulatory Visit: Payer: Self-pay

## 2022-11-13 ENCOUNTER — Encounter (HOSPITAL_COMMUNITY): Payer: Self-pay | Admitting: Gastroenterology

## 2022-11-13 ENCOUNTER — Ambulatory Visit (HOSPITAL_COMMUNITY)
Admission: RE | Admit: 2022-11-13 | Discharge: 2022-11-13 | Disposition: A | Discharge status: Home / Self Care | Payer: BC Managed Care – PPO | Source: Ambulatory Visit | Attending: Gastroenterology | Admitting: Gastroenterology

## 2022-11-13 DIAGNOSIS — Z09 Encounter for follow-up examination after completed treatment for conditions other than malignant neoplasm: Secondary | ICD-10-CM | POA: Insufficient documentation

## 2022-11-13 DIAGNOSIS — F419 Anxiety disorder, unspecified: Secondary | ICD-10-CM | POA: Diagnosis not present

## 2022-11-13 DIAGNOSIS — Z86010 Personal history of colonic polyps: Secondary | ICD-10-CM | POA: Diagnosis not present

## 2022-11-13 DIAGNOSIS — Z79899 Other long term (current) drug therapy: Secondary | ICD-10-CM | POA: Insufficient documentation

## 2022-11-13 DIAGNOSIS — Z800 Family history of malignant neoplasm of digestive organs: Secondary | ICD-10-CM | POA: Diagnosis not present

## 2022-11-13 DIAGNOSIS — D125 Benign neoplasm of sigmoid colon: Secondary | ICD-10-CM | POA: Diagnosis not present

## 2022-11-13 DIAGNOSIS — D122 Benign neoplasm of ascending colon: Secondary | ICD-10-CM | POA: Diagnosis not present

## 2022-11-13 DIAGNOSIS — Z1211 Encounter for screening for malignant neoplasm of colon: Secondary | ICD-10-CM | POA: Diagnosis not present

## 2022-11-13 DIAGNOSIS — Z83719 Family history of colon polyps, unspecified: Secondary | ICD-10-CM | POA: Insufficient documentation

## 2022-11-13 DIAGNOSIS — D123 Benign neoplasm of transverse colon: Secondary | ICD-10-CM | POA: Diagnosis not present

## 2022-11-13 DIAGNOSIS — K635 Polyp of colon: Secondary | ICD-10-CM | POA: Diagnosis not present

## 2022-11-13 HISTORY — PX: COLONOSCOPY WITH PROPOFOL: SHX5780

## 2022-11-13 HISTORY — PX: POLYPECTOMY: SHX5525

## 2022-11-13 LAB — POCT PREGNANCY, URINE: Preg Test, Ur: NEGATIVE

## 2022-11-13 SURGERY — COLONOSCOPY WITH PROPOFOL
Anesthesia: General

## 2022-11-13 MED ORDER — PHENYLEPHRINE 80 MCG/ML (10ML) SYRINGE FOR IV PUSH (FOR BLOOD PRESSURE SUPPORT)
PREFILLED_SYRINGE | INTRAVENOUS | Status: AC
  Filled 2022-11-13: qty 10

## 2022-11-13 MED ORDER — PROPOFOL 500 MG/50ML IV EMUL
INTRAVENOUS | Status: AC
  Filled 2022-11-13: qty 50

## 2022-11-13 MED ORDER — LACTATED RINGERS IV SOLN: INTRAVENOUS | Status: DC

## 2022-11-13 MED ORDER — LIDOCAINE HCL (PF) 2 % IJ SOLN
INTRAMUSCULAR | Status: AC
  Filled 2022-11-13: qty 5

## 2022-11-13 MED ORDER — PHENYLEPHRINE HCL (PRESSORS) 10 MG/ML IV SOLN
INTRAVENOUS | Status: DC | PRN
  Administered 2022-11-13: 80 ug via INTRAVENOUS
  Administered 2022-11-13: 160 ug via INTRAVENOUS
  Administered 2022-11-13 (×2): 40 ug via INTRAVENOUS

## 2022-11-13 MED ORDER — PROPOFOL 500 MG/50ML IV EMUL
INTRAVENOUS | Status: DC | PRN
  Administered 2022-11-13: 175 ug/kg/min via INTRAVENOUS

## 2022-11-13 MED ORDER — PROPOFOL 10 MG/ML IV BOLUS
INTRAVENOUS | Status: DC | PRN
  Administered 2022-11-13: 60 mg via INTRAVENOUS
  Administered 2022-11-13: 20 mg via INTRAVENOUS

## 2022-11-13 NOTE — Anesthesia Preprocedure Evaluation (Signed)
Anesthesia Evaluation  Patient identified by MRN, date of birth, ID band Patient awake    Reviewed: Allergy & Precautions, H&P , NPO status , Patient's Chart, lab work & pertinent test results, reviewed documented beta blocker date and time   Airway Mallampati: II  TM Distance: >3 FB Neck ROM: full    Dental no notable dental hx.    Pulmonary neg pulmonary ROS   Pulmonary exam normal breath sounds clear to auscultation       Cardiovascular Exercise Tolerance: Good negative cardio ROS  Rhythm:regular Rate:Normal     Neuro/Psych   Anxiety     negative neurological ROS  negative psych ROS   GI/Hepatic negative GI ROS, Neg liver ROS,,,  Endo/Other  negative endocrine ROS    Renal/GU negative Renal ROS  negative genitourinary   Musculoskeletal   Abdominal   Peds  Hematology negative hematology ROS (+)   Anesthesia Other Findings   Reproductive/Obstetrics negative OB ROS                             Anesthesia Physical Anesthesia Plan  ASA: 2  Anesthesia Plan: General   Post-op Pain Management:    Induction:   PONV Risk Score and Plan: Propofol infusion  Airway Management Planned:   Additional Equipment:   Intra-op Plan:   Post-operative Plan:   Informed Consent: I have reviewed the patients History and Physical, chart, labs and discussed the procedure including the risks, benefits and alternatives for the proposed anesthesia with the patient or authorized representative who has indicated his/her understanding and acceptance.     Dental Advisory Given  Plan Discussed with: CRNA  Anesthesia Plan Comments:        Anesthesia Quick Evaluation  

## 2022-11-13 NOTE — Transfer of Care (Signed)
Immediate Anesthesia Transfer of Care Note  Patient: Ann Campbell  Procedure(s) Performed: COLONOSCOPY WITH PROPOFOL POLYPECTOMY  Patient Location: Endoscopy Unit  Anesthesia Type:General  Level of Consciousness: awake, alert , and oriented  Airway & Oxygen Therapy: Patient Spontanous Breathing  Post-op Assessment: Report given to RN, Post -op Vital signs reviewed and stable, Patient moving all extremities X 4, and Patient able to stick tongue midline  Post vital signs: Reviewed  Last Vitals:  Vitals Value Taken Time  BP 101/62 11/13/22 0808  Temp 36.4 C 11/13/22 0808  Pulse 75 11/13/22 0808  Resp 22 11/13/22 0808  SpO2 100 % 11/13/22 0808    Last Pain:  Vitals:   11/13/22 0808  TempSrc:   PainSc: 0-No pain      Patients Stated Pain Goal: 6 (11/13/22 0658)  Complications: No notable events documented.

## 2022-11-13 NOTE — Discharge Instructions (Addendum)
You are being discharged to home.  Resume your previous diet.  We are waiting for your pathology results.  Your physician has recommended a repeat colonoscopy for surveillance based on pathology results.  

## 2022-11-13 NOTE — Op Note (Signed)
[facility] Patient Name: Ann Campbell Procedure Date: 11/13/2022 7:40 AM MRN: [id number] Date of Birth: August 28, 1975 Attending MD: Katrinka Blazing , , [id number] CSN: [id number] Age: 47 Admit Type: Outpatient Procedure:                Colonoscopy Indications:              High risk colon cancer surveillance: Personal                            history of sessile serrated colon polyp (10 mm or                            greater in size) Providers:                Katrinka Blazing, Edrick Kins, RN, Pandora Leiter,                            Technician Referring MD:              Medicines:                Monitored Anesthesia Care Complications:            No immediate complications. Estimated Blood Loss:     Estimated blood loss: none. Procedure:                Pre-Anesthesia Assessment:                           - Prior to the procedure, a History and Physical                            was performed, and patient medications, allergies                            and sensitivities were reviewed. The patient's                            tolerance of previous anesthesia was reviewed.                           - The risks and benefits of the procedure and the                            sedation options and risks were discussed with the                            patient. All questions were answered and informed                            consent was obtained.                           - ASA Grade Assessment: I - A normal, healthy                            patient.  After obtaining informed consent, the colonoscope                            was passed under direct vision. Throughout the                            procedure, the patient's blood pressure, pulse, and                            oxygen saturations were monitored continuously. The                            [id number]-HQ190L ([id number]) scope was introduced through                            the anus and  advanced to the the cecum, identified                            by appendiceal orifice and ileocecal valve. The                            colonoscopy was performed without difficulty. The                            patient tolerated the procedure well. The quality                            of the bowel preparation was excellent. Scope In: 7:42:24 AM Scope Out: 8:04:17 AM Scope Withdrawal Time: 0 hours 18 minutes 14 seconds  Total Procedure Duration: 0 hours 21 minutes 53 seconds  Findings:      The perianal and digital rectal examinations were normal.      The terminal ileum appeared normal.      Two semi-sessile polyps were found in the transverse colon and ascending       colon. The polyps were 5 to 6 mm in size. These polyps were removed with       a cold snare. Resection and retrieval were complete.      A 10 mm polyp was found in the sigmoid colon. The polyp was sessile. The       polyp was removed with a cold snare. Resection and retrieval were       complete.      The retroflexed view of the distal rectum and anal verge was normal and       showed no anal or rectal abnormalities.      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 Impression:               - The examined portion of the ileum was normal.                           - Two 5 to 6 mm polyps in the transverse colon and  in the ascending colon, removed with a cold snare.                            Resected and retrieved.                           - One 10 mm polyp in the sigmoid colon, removed                            with a cold snare. Resected and retrieved.                           - The distal rectum and anal verge are normal on                            retroflexion view. Moderate Sedation:      Per Anesthesia Care Recommendation:           - Discharge patient to home (ambulatory).                           - Resume  previous diet.                           - Await pathology results.                           - Repeat colonoscopy for surveillance based on                            pathology results. Procedure Code(s):        --- Professional ---                           [phone], Colonoscopy, flexible; with removal of                            tumor(s), polyp(s), or other lesion(s) by snare                            technique Diagnosis Code(s):        --- Professional ---                           Z86.010, Personal history of colonic polyps                           D12.3, Benign neoplasm of transverse colon (hepatic                            flexure or splenic flexure)                           D12.2, Benign neoplasm of ascending colon                           D12.5, Benign neoplasm of  sigmoid colon CPT copyright 2022 [employer]. All rights reserved. The codes documented in this report are preliminary and upon coder review may  be revised to meet current compliance requirements. Katrinka Blazing, MD Katrinka Blazing,  11/13/2022 8:17:26 AM This report has been signed electronically. Number of Addenda: 0

## 2022-11-13 NOTE — H&P (Signed)
Ann Campbell is an 47 y.o. female.   Chief Complaint: history of colon polyps. HPI: 47 y/o F with PMH anxiety, coming for history of colon polyps.Last colonoscopy was in 2023, had 7 SSLs . The patient denies having any complaints such as melena, hematochezia, abdominal pain or distention, change in her bowel movement consistency or frequency, no changes in weight recently.  Grandmother had history of colon cancer.   Past Medical History:  Diagnosis Date   Anxiety    Contraceptive management 07/20/2014   Menstrual cramps 07/20/2014   PMS (premenstrual syndrome) 07/20/2014   Skin cyst 03/10/2013   Right clavicle area very mobile    Past Surgical History:  Procedure Laterality Date   COLONOSCOPY WITH PROPOFOL N/A 09/13/2021   Procedure: COLONOSCOPY WITH PROPOFOL;  Surgeon: Malissa Hippo, MD;  Location: [facility];  Service: Endoscopy;  Laterality: N/A;  910   FOOT SURGERY Right    HEMOSTASIS CLIP PLACEMENT  09/13/2021   Procedure: HEMOSTASIS CLIP PLACEMENT;  Surgeon: Malissa Hippo, MD;  Location: [facility];  Service: Endoscopy;;   HOT HEMOSTASIS  09/13/2021   Procedure: HOT HEMOSTASIS (ARGON PLASMA COAGULATION/BICAP);  Surgeon: Malissa Hippo, MD;  [facility];  Service: Endoscopy;;   POLYPECTOMY  09/13/2021   Procedure: POLYPECTOMY;  Surgeon: Malissa Hippo, MD;  [facility];  Service: Endoscopy;;   SUBMUCOSAL LIFTING INJECTION  09/13/2021   Procedure: SUBMUCOSAL LIFTING INJECTION;  Surgeon: Malissa Hippo, MD;  Location: [facility];  Service: Endoscopy;;    Family History  Problem Relation Age of Onset   Fibromyalgia Mother    Hypertension Mother    Colon polyps Father    Heart disease Maternal Grandmother    Heart disease Maternal Grandfather    Colon cancer Paternal Grandmother        dx. 17s   Social History:  reports that she has never smoked. She has never used smokeless tobacco. She reports current alcohol use. She reports that she does  not use drugs.  Allergies:  Allergies  Allergen Reactions   Bee Venom Anaphylaxis and Hives   Augmentin [Amoxicillin-Pot Clavulanate]     GI Intolerance   Sulfa Antibiotics Rash    Medications Prior to Admission  Medication Sig Dispense Refill   amoxicillin-clavulanate (AUGMENTIN) 875-125 MG tablet Take 1 tablet by mouth every 12 (twelve) hours. 14 tablet 0   estradiol (ESTRACE) 0.1 MG/GM vaginal cream Place 1 Applicatorful vaginally at bedtime as needed (pt prefrence).     ketoconazole (NIZORAL) 2 % shampoo Apply 1 application. topically daily as needed for irritation.     loratadine (CLARITIN) 10 MG tablet Take 10 mg by mouth daily.     Melatonin 5 MG CAPS Take 5 mg by mouth at bedtime as needed (sleep).     Omega-3 Fatty Acids (FISH OIL PO) Take 2 capsules by mouth daily.     ondansetron (ZOFRAN) 4 MG tablet Take 1 tablet (4 mg total) by mouth every 6 (six) hours. 12 tablet 0   progesterone (PROMETRIUM) 100 MG capsule Take 100 mg by mouth at bedtime.     Red Yeast Rice 600 MG CAPS Take 1,200 mg by mouth daily.     sertraline (ZOLOFT) 50 MG tablet Take 1 tablet by mouth once daily 90 tablet 3   EPINEPHrine 0.3 mg/0.3 mL IJ SOAJ injection Inject 0.3 mg into the muscle as needed for anaphylaxis.      Results for orders placed or performed during  the hospital encounter of 11/13/22 (from the past 48 hour(s))  Pregnancy, urine POC     Status: None   Collection Time: 11/13/22  7:12 AM  Result Value Ref Range   Preg Test, Ur NEGATIVE NEGATIVE    Comment:        THE SENSITIVITY OF THIS METHODOLOGY IS >24 mIU/mL    No results found.  Review of Systems  All other systems reviewed and are negative.   Blood pressure 113/78, pulse 71, temperature 97.9 F (36.6 C), temperature source Oral, resp. rate 13, height 5\' 5"  (1.651 m), weight 74.8 kg, last menstrual period 10/10/2022, SpO2 99 %. Physical Exam  GENERAL: The patient is AO x3, in no acute distress. HEENT: Head is normocephalic  and atraumatic. EOMI are intact. Mouth is well hydrated and without lesions. NECK: Supple. No masses LUNGS: Clear to auscultation. No presence of rhonchi/wheezing/rales. Adequate chest expansion HEART: RRR, normal s1 and s2. ABDOMEN: Soft, nontender, no guarding, no peritoneal signs, and nondistended. BS +. No masses. EXTREMITIES: Without any cyanosis, clubbing, rash, lesions or edema. NEUROLOGIC: AOx3, no focal motor deficit. SKIN: no jaundice, no rashes  Assessment/Plan 47 y/o F with PMH anxiety, coming for history of colon polyps. The patient is at average risk for colorectal cancer.  We will proceed with colonoscopy today.   Dolores Frame, MD 11/13/2022, 7:32 AM

## 2022-11-14 NOTE — Anesthesia Postprocedure Evaluation (Signed)
Anesthesia Post Note  Patient: Ann Campbell  Procedure(s) Performed: COLONOSCOPY WITH PROPOFOL POLYPECTOMY  Patient location during evaluation: PACU Anesthesia Type: General Level of consciousness: awake and alert Pain management: pain level controlled Vital Signs Assessment: post-procedure vital signs reviewed and stable Respiratory status: spontaneous breathing, nonlabored ventilation, respiratory function stable and patient connected to nasal cannula oxygen Cardiovascular status: blood pressure returned to baseline and stable Postop Assessment: no apparent nausea or vomiting Anesthetic complications: no   No notable events documented.   Last Vitals:  Vitals:   11/13/22 0658 11/13/22 0808  BP: 113/78 101/62  Pulse: 71 75  Resp: 13 (!) 22  Temp: 36.6 C 36.4 C  SpO2: 99% 100%    Last Pain:  Vitals:   11/13/22 0808  TempSrc:   PainSc: 0-No pain                 Windell Norfolk

## 2022-11-18 ENCOUNTER — Encounter (INDEPENDENT_AMBULATORY_CARE_PROVIDER_SITE_OTHER): Payer: Self-pay | Admitting: *Deleted

## 2022-11-18 LAB — SURGICAL PATHOLOGY

## 2022-11-20 ENCOUNTER — Encounter (HOSPITAL_COMMUNITY): Payer: Self-pay | Admitting: Gastroenterology

## 2023-03-24 ENCOUNTER — Other Ambulatory Visit (HOSPITAL_COMMUNITY): Payer: Self-pay | Admitting: Adult Health

## 2023-03-24 DIAGNOSIS — Z1231 Encounter for screening mammogram for malignant neoplasm of breast: Secondary | ICD-10-CM

## 2023-04-14 ENCOUNTER — Encounter (INDEPENDENT_AMBULATORY_CARE_PROVIDER_SITE_OTHER): Payer: Self-pay | Admitting: *Deleted

## 2023-05-02 ENCOUNTER — Telehealth (INDEPENDENT_AMBULATORY_CARE_PROVIDER_SITE_OTHER): Payer: Self-pay | Admitting: Gastroenterology

## 2023-05-02 NOTE — Telephone Encounter (Signed)
Who is your primary care physician: Dr.Gordan Mayford [facility]  Reasons for the colonoscopy: 6 month recall  Have you had a colonoscopy before?  Yes 10/2022  Do you have family history of colon cancer? Yes grandmother  Previous colonoscopy with polyps removed? Yes 6/24;6/23  Do you have a history colorectal cancer?   no  Are you diabetic? If yes, Type 1 or Type 2?    no  Do you have a prosthetic or mechanical heart valve? no  Do you have a pacemaker/defibrillator?   no  Have you had endocarditis/atrial fibrillation? no  Have you had joint replacement within the last 12 months?  no  Do you tend to be constipated or have to use laxatives? no  Do you have any history of drugs or alchohol?  no  Do you use supplemental oxygen?  no  Have you had a stroke or heart attack within the last 6 months? no  Do you take weight loss medication?  no  For female patients: have you had a hysterectomy?  no                                     are you post menopausal?       no                                            do you still have your menstrual cycle? yes      Do you take any blood-thinning medications such as: (aspirin, warfarin, Plavix, Aggrenox)  no  If yes we need the name, milligram, dosage and who is prescribing doctor  Current Outpatient Medications on File Prior to Visit  Medication Sig Dispense Refill   EPINEPHrine 0.3 mg/0.3 mL IJ SOAJ injection Inject 0.3 mg into the muscle as needed for anaphylaxis.     estradiol (ESTRACE) 0.1 MG/GM vaginal cream Place 1 Applicatorful vaginally at bedtime as needed (pt prefrence).     loratadine (CLARITIN) 10 MG tablet Take 10 mg by mouth daily.     Melatonin 5 MG CAPS Take 5 mg by mouth at bedtime as needed (sleep).     Omega-3 Fatty Acids (FISH OIL PO) Take 2 capsules by mouth daily.     ondansetron (ZOFRAN) 4 MG tablet Take 1 tablet (4 mg total) by mouth every 6 (six) hours. 12 tablet 0   progesterone (PROMETRIUM) 100 MG capsule Take  100 mg by mouth at bedtime.     Red Yeast Rice 600 MG CAPS Take 1,200 mg by mouth daily.     sertraline (ZOLOFT) 50 MG tablet Take 1 tablet by mouth once daily 90 tablet 3   amoxicillin-clavulanate (AUGMENTIN) 875-125 MG tablet Take 1 tablet by mouth every 12 (twelve) hours. (Patient not taking: Reported on 05/02/2023) 14 tablet 0   ketoconazole (NIZORAL) 2 % shampoo Apply 1 application. topically daily as needed for irritation. (Patient not taking: Reported on 05/02/2023)     No current facility-administered medications on file prior to visit.    Allergies  Allergen Reactions   Bee Venom Anaphylaxis and Hives   Augmentin [Amoxicillin-Pot Clavulanate]     GI Intolerance   Sulfa Antibiotics Rash     Pharmacy: [facility]  Primary Insurance Name: [employer]  Best number where you can be reached: [phone]

## 2023-05-09 ENCOUNTER — Ambulatory Visit (HOSPITAL_COMMUNITY)
Admission: RE | Admit: 2023-05-09 | Discharge: 2023-05-09 | Disposition: A | Discharge status: Home / Self Care | Payer: BC Managed Care – PPO | Source: Ambulatory Visit | Attending: Adult Health | Admitting: Adult Health

## 2023-05-09 ENCOUNTER — Encounter (HOSPITAL_COMMUNITY): Payer: Self-pay

## 2023-05-09 DIAGNOSIS — Z1231 Encounter for screening mammogram for malignant neoplasm of breast: Secondary | ICD-10-CM | POA: Diagnosis not present

## 2023-05-15 NOTE — Telephone Encounter (Signed)
Room 1 Thanks

## 2023-05-16 ENCOUNTER — Other Ambulatory Visit (HOSPITAL_COMMUNITY): Payer: Self-pay | Admitting: Adult Health

## 2023-05-16 DIAGNOSIS — R928 Other abnormal and inconclusive findings on diagnostic imaging of breast: Secondary | ICD-10-CM

## 2023-05-16 MED ORDER — PEG 3350-KCL-NA BICARB-NACL 420 G PO SOLR: 4000.0000 mL | Freq: Once | ORAL | 0 refills | Status: AC

## 2023-05-16 NOTE — Telephone Encounter (Signed)
Questionnaire from recall, no referral needed  

## 2023-05-16 NOTE — Telephone Encounter (Signed)
Pt contacted and scheduled fro 05/28/23. Prep sent to pharmacy. Instructions placed on my chart. No PA needed per insurance.

## 2023-05-16 NOTE — Addendum Note (Signed)
Addended by: Marlowe Shores on: 05/16/2023 09:52 AM   Modules accepted: Orders

## 2023-05-19 ENCOUNTER — Telehealth: Payer: Self-pay | Admitting: Adult Health

## 2023-05-19 NOTE — Telephone Encounter (Signed)
Pt has follow up mammogram tomorrow for possible asymmetry in right breast. Discussed not to worry, just be cautious.

## 2023-05-20 ENCOUNTER — Ambulatory Visit (HOSPITAL_COMMUNITY)
Admission: RE | Admit: 2023-05-20 | Discharge: 2023-05-20 | Disposition: A | Discharge status: Home / Self Care | Payer: BC Managed Care – PPO | Source: Ambulatory Visit | Attending: Adult Health | Admitting: Adult Health

## 2023-05-20 ENCOUNTER — Encounter (HOSPITAL_COMMUNITY): Payer: Self-pay

## 2023-05-20 DIAGNOSIS — R928 Other abnormal and inconclusive findings on diagnostic imaging of breast: Secondary | ICD-10-CM | POA: Diagnosis present

## 2023-05-26 ENCOUNTER — Encounter (INDEPENDENT_AMBULATORY_CARE_PROVIDER_SITE_OTHER): Payer: Self-pay

## 2023-05-26 ENCOUNTER — Other Ambulatory Visit (HOSPITAL_COMMUNITY)
Admission: RE | Admit: 2023-05-26 | Discharge: 2023-05-26 | Disposition: A | Discharge status: Home / Self Care | Payer: 59 | Source: Ambulatory Visit | Attending: Gastroenterology | Admitting: Gastroenterology

## 2023-05-26 ENCOUNTER — Telehealth (INDEPENDENT_AMBULATORY_CARE_PROVIDER_SITE_OTHER): Payer: Self-pay | Admitting: Gastroenterology

## 2023-05-26 DIAGNOSIS — Z1211 Encounter for screening for malignant neoplasm of colon: Secondary | ICD-10-CM | POA: Diagnosis not present

## 2023-05-26 LAB — PREGNANCY, URINE: Preg Test, Ur: NEGATIVE

## 2023-05-26 NOTE — Telephone Encounter (Signed)
 Mychart message sent to patient.

## 2023-05-26 NOTE — Telephone Encounter (Signed)
 Hi, This should not interfere with colonoscopy, so she is good to go. Thanks

## 2023-05-26 NOTE — Telephone Encounter (Signed)
 Pt called in and is down for a colonoscopy on Wednesday 05/28/23 and is due to start her cycle any day. Pt is wondering if it is ok to go through with colonoscopy. Pt states her cycle last about 2 days. Please advise. Thank you. Pt states she is ok going through with procedure if provider is.

## 2023-05-28 ENCOUNTER — Ambulatory Visit (HOSPITAL_BASED_OUTPATIENT_CLINIC_OR_DEPARTMENT_OTHER): Payer: 59 | Admitting: Anesthesiology

## 2023-05-28 ENCOUNTER — Encounter (HOSPITAL_COMMUNITY): Payer: Self-pay | Admitting: Gastroenterology

## 2023-05-28 ENCOUNTER — Encounter (HOSPITAL_COMMUNITY)
Admission: RE | Disposition: A | Discharge status: Home / Self Care | Payer: Self-pay | Source: Ambulatory Visit | Attending: Gastroenterology

## 2023-05-28 ENCOUNTER — Ambulatory Visit (HOSPITAL_COMMUNITY): Payer: 59 | Admitting: Anesthesiology

## 2023-05-28 ENCOUNTER — Other Ambulatory Visit: Payer: Self-pay

## 2023-05-28 ENCOUNTER — Ambulatory Visit (HOSPITAL_COMMUNITY)
Admission: RE | Admit: 2023-05-28 | Discharge: 2023-05-28 | Disposition: A | Discharge status: Home / Self Care | Payer: 59 | Source: Ambulatory Visit | Attending: Gastroenterology | Admitting: Gastroenterology

## 2023-05-28 DIAGNOSIS — Z800 Family history of malignant neoplasm of digestive organs: Secondary | ICD-10-CM | POA: Insufficient documentation

## 2023-05-28 DIAGNOSIS — Z83719 Family history of colon polyps, unspecified: Secondary | ICD-10-CM | POA: Insufficient documentation

## 2023-05-28 DIAGNOSIS — K635 Polyp of colon: Secondary | ICD-10-CM

## 2023-05-28 DIAGNOSIS — K621 Rectal polyp: Secondary | ICD-10-CM | POA: Diagnosis not present

## 2023-05-28 DIAGNOSIS — D128 Benign neoplasm of rectum: Secondary | ICD-10-CM | POA: Diagnosis not present

## 2023-05-28 DIAGNOSIS — I10 Essential (primary) hypertension: Secondary | ICD-10-CM | POA: Insufficient documentation

## 2023-05-28 DIAGNOSIS — D126 Benign neoplasm of colon, unspecified: Secondary | ICD-10-CM | POA: Diagnosis not present

## 2023-05-28 DIAGNOSIS — D124 Benign neoplasm of descending colon: Secondary | ICD-10-CM | POA: Diagnosis not present

## 2023-05-28 DIAGNOSIS — D122 Benign neoplasm of ascending colon: Secondary | ICD-10-CM | POA: Insufficient documentation

## 2023-05-28 DIAGNOSIS — K648 Other hemorrhoids: Secondary | ICD-10-CM | POA: Diagnosis not present

## 2023-05-28 DIAGNOSIS — Z860100 Personal history of colon polyps, unspecified: Secondary | ICD-10-CM | POA: Diagnosis not present

## 2023-05-28 DIAGNOSIS — F419 Anxiety disorder, unspecified: Secondary | ICD-10-CM | POA: Diagnosis not present

## 2023-05-28 HISTORY — PX: SUBMUCOSAL LIFTING INJECTION: SHX6855

## 2023-05-28 HISTORY — PX: COLONOSCOPY WITH PROPOFOL: SHX5780

## 2023-05-28 HISTORY — PX: POLYPECTOMY: SHX5525

## 2023-05-28 LAB — HM COLONOSCOPY

## 2023-05-28 SURGERY — COLONOSCOPY WITH PROPOFOL
Anesthesia: General

## 2023-05-28 MED ORDER — EPHEDRINE SULFATE-NACL 50-0.9 MG/10ML-% IV SOSY
PREFILLED_SYRINGE | INTRAVENOUS | Status: DC | PRN
  Administered 2023-05-28 (×2): 5 mg via INTRAVENOUS

## 2023-05-28 MED ORDER — LACTATED RINGERS IV SOLN: INTRAVENOUS | Status: DC

## 2023-05-28 MED ORDER — LIDOCAINE HCL (CARDIAC) PF 100 MG/5ML IV SOSY
PREFILLED_SYRINGE | INTRAVENOUS | Status: DC | PRN
  Administered 2023-05-28: 60 mg via INTRAVENOUS

## 2023-05-28 MED ORDER — PROPOFOL 500 MG/50ML IV EMUL
INTRAVENOUS | Status: DC | PRN
  Administered 2023-05-28: 200 ug/kg/min via INTRAVENOUS
  Administered 2023-05-28: 100 mg via INTRAVENOUS

## 2023-05-28 MED ORDER — LACTATED RINGERS IV SOLN: INTRAVENOUS | Status: DC | PRN

## 2023-05-28 MED ORDER — PHENYLEPHRINE 80 MCG/ML (10ML) SYRINGE FOR IV PUSH (FOR BLOOD PRESSURE SUPPORT)
PREFILLED_SYRINGE | INTRAVENOUS | Status: DC | PRN
  Administered 2023-05-28 (×4): 80 ug via INTRAVENOUS

## 2023-05-28 NOTE — H&P (Addendum)
 Ann Campbell is an 48 y.o. female.   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  The patient denies having any nausea, vomiting, fever, chills, hematochezia, melena, hematemesis, abdominal distention, abdominal pain, diarrhea, jaundice, pruritus or weight loss.  Past Medical History:  Diagnosis Date   Anxiety    Contraceptive management 07/20/2014   Menstrual cramps 07/20/2014   PMS (premenstrual syndrome) 07/20/2014   Skin cyst 03/10/2013   Right clavicle area very mobile    Past Surgical History:  Procedure Laterality Date   COLONOSCOPY WITH PROPOFOL  N/A 09/13/2021   Procedure: COLONOSCOPY WITH PROPOFOL ;  Surgeon: Golda Claudis PENNER, MD;  [facility];  Service: Endoscopy;  Laterality: N/A;  910   COLONOSCOPY WITH PROPOFOL  N/A 11/13/2022   Procedure: COLONOSCOPY WITH PROPOFOL ;  Surgeon: Eartha Angelia Sieving, MD;  [facility]: [facility];  Service: Gastroenterology;  Laterality: N/A;  10:45AM;ASA 1-2, pt knows to arrive at 6:15   FOOT SURGERY Right    HEMOSTASIS CLIP PLACEMENT  09/13/2021   Procedure: HEMOSTASIS CLIP PLACEMENT;  Surgeon: Golda Claudis PENNER, MD;  [facility];  Service: Endoscopy;;   HOT HEMOSTASIS  09/13/2021   Procedure: HOT HEMOSTASIS (ARGON PLASMA COAGULATION/BICAP);  Surgeon: Golda Claudis PENNER, MD;  [facility];  Service: Endoscopy;;   POLYPECTOMY  09/13/2021   Procedure: POLYPECTOMY;  Surgeon: Golda Claudis PENNER, MD;  [facility];  Service: Endoscopy;;   POLYPECTOMY  11/13/2022   Procedure: POLYPECTOMY;  Surgeon: Eartha Angelia Sieving, MD;  [facility];  Service: Gastroenterology;;   SUBMUCOSAL LIFTING INJECTION  09/13/2021   Procedure: SUBMUCOSAL LIFTING INJECTION;  Surgeon: Golda Claudis PENNER, MD;  [facility];  Service: Endoscopy;;    Family History  Problem Relation Age of Onset    Fibromyalgia Mother    Hypertension Mother    Colon polyps Father    Heart disease Maternal Grandmother    Heart disease Maternal Grandfather    Colon cancer Paternal Grandmother        dx. 33s   Social History:  reports that she has never smoked. She has never used smokeless tobacco. She reports current alcohol use. She reports that she does not use drugs.  Allergies:  Allergies  Allergen Reactions   Bee Venom Anaphylaxis and Hives   Augmentin  [Amoxicillin -Pot Clavulanate]     GI Intolerance   Sulfa Antibiotics Rash    Medications Prior to Admission  Medication Sig Dispense Refill   estradiol (ESTRACE) 0.1 MG/GM vaginal cream Place 1 Applicatorful vaginally at bedtime as needed (pt prefrence).     loratadine (CLARITIN) 10 MG tablet Take 10 mg by mouth daily.     Red Yeast Rice 600 MG CAPS Take 1,200 mg by mouth daily.     sertraline  (ZOLOFT ) 50 MG tablet Take 1 tablet by mouth once daily 90 tablet 3   amoxicillin -clavulanate (AUGMENTIN ) 875-125 MG tablet Take 1 tablet by mouth every 12 (twelve) hours. (Patient not taking: Reported on 05/02/2023) 14 tablet 0   EPINEPHrine  0.3 mg/0.3 mL IJ SOAJ injection Inject 0.3 mg into the muscle as needed for anaphylaxis.     ketoconazole (NIZORAL) 2 % shampoo Apply 1 application. topically daily as needed for irritation. (Patient not taking: Reported on 05/02/2023)     Melatonin 5 MG CAPS Take 5 mg by mouth at bedtime as needed (sleep).     Omega-3 Fatty Acids (FISH OIL PO)  Take 2 capsules by mouth daily.     ondansetron  (ZOFRAN ) 4 MG tablet Take 1 tablet (4 mg total) by mouth every 6 (six) hours. 12 tablet 0   progesterone (PROMETRIUM) 100 MG capsule Take 100 mg by mouth at bedtime.      No results found for this or any previous visit (from the past 48 hours). No results found.  Review of Systems  All other systems reviewed and are negative.   Blood pressure 123/83, pulse 69, temperature 98.4 F (36.9 C), temperature source Oral, resp.  rate 17, height 5' 5 (1.651 m), weight 74.8 kg, last menstrual period 05/27/2023, SpO2 100%. Physical Exam  GENERAL: The patient is AO x3, in no acute distress. HEENT: Head is normocephalic and atraumatic. EOMI are intact. Mouth is well hydrated and without lesions. NECK: Supple. No masses LUNGS: Clear to auscultation. No presence of rhonchi/wheezing/rales. Adequate chest expansion HEART: RRR, normal s1 and s2. ABDOMEN: Soft, nontender, no guarding, no peritoneal signs, and nondistended. BS +. No masses. EXTREMITIES: Without any cyanosis, clubbing, rash, lesions or edema. NEUROLOGIC: AOx3, no focal motor deficit. 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  Will proceed with colonoscopy.  Toribio Eartha Flavors, MD 05/28/2023, 11:25 AM

## 2023-05-28 NOTE — Discharge Instructions (Signed)
 You are being discharged to home.  Resume your previous diet.  We are waiting for your pathology results.  Your physician has recommended a repeat colonoscopy in one year for surveillance.

## 2023-05-28 NOTE — Op Note (Signed)
 [facility] Patient Name: Ann Campbell Procedure Date: 05/28/2023 10:46 AM MRN: [id number] Date of Birth: 1976-05-17 Attending MD: Toribio Fortune , , [id number] CSN: [id number] Age: 48 Admit Type: Outpatient Procedure:                Colonoscopy Indications:              Serrated polyposis syndrome Providers:                Toribio Fortune, Devere Lodge, Jon Loge Referring MD:              Medicines:                Monitored Anesthesia Care Complications:            No immediate complications. Estimated Blood Loss:     Estimated blood loss: none. Procedure:                Pre-Anesthesia Assessment:                           - Prior to the procedure, a History and Physical                            was performed, and patient medications, allergies                            and sensitivities were reviewed. The patient's                            tolerance of previous anesthesia was reviewed.                           - The risks and benefits of the procedure and the                            sedation options and risks were discussed with the                            patient. All questions were answered and informed                            consent was obtained.                           - ASA Grade Assessment: I - A normal, healthy                            patient.                           After obtaining informed consent, the colonoscope                            was passed under direct vision. Throughout the                            procedure, the patient's blood pressure, pulse, and  oxygen saturations were monitored continuously. The                            [id number]-HQ190L ([id number]) scope was introduced through                            the anus and advanced to the the cecum, identified                            by appendiceal orifice and ileocecal valve. The                            colonoscopy was performed without  difficulty. The                            patient tolerated the procedure well. The quality                            of the bowel preparation was excellent. Scope In: 11:39:10 AM Scope Out: 12:03:12 PM Scope Withdrawal Time: 0 hours 17 minutes 37 seconds  Total Procedure Duration: 0 hours 24 minutes 2 seconds  Findings:      The perianal and digital rectal examinations were normal.      An 8 mm polyp was found in the ascending colon. The polyp was       semi-sessile. Area was successfully injected with 1 mL Eleview for a       lift polypectomy. Imaging was performed using white light and narrow       band imaging to visualize the mucosa and demarcate the polyp site after       injection for EMR purposes. The polyp was removed with a cold snare.       Resection and retrieval were complete.      Two sessile polyps were found in the rectum and descending colon. The       polyps were 4 to 6 mm in size. These polyps were removed with a cold       snare. Resection and retrieval were complete.      Non-bleeding internal hemorrhoids were found during retroflexion. The       hemorrhoids were small. Impression:               - One 8 mm polyp in the ascending colon, removed                            with a cold snare. Resected and retrieved. Injected.                           - Two 4 to 6 mm polyps in the rectum and in the                            descending colon, removed with a cold snare.                            Resected and retrieved.                           -  Non-bleeding internal hemorrhoids. Moderate Sedation:      Per Anesthesia Care Recommendation:           - Discharge patient to home (ambulatory).                           - Resume previous diet.                           - Await pathology results.                           - Repeat colonoscopy in 1 year for surveillance. Procedure Code(s):        --- Professional ---                           [phone], 59, Colonoscopy,  flexible; with removal of                            tumor(s), polyp(s), or other lesion(s) by snare                            technique                           45381, Colonoscopy, flexible; with directed                            submucosal injection(s), any substance Diagnosis Code(s):        --- Professional ---                           D12.2, Benign neoplasm of ascending colon                           D12.8, Benign neoplasm of rectum                           D12.4, Benign neoplasm of descending colon                           K64.8, Other hemorrhoids                           D12.6, Benign neoplasm of colon, unspecified CPT copyright 2022 American Medical Association. All rights reserved. The codes documented in this report are preliminary and upon coder review may  be revised to meet current compliance requirements. Toribio Fortune, MD Toribio Fortune,  05/28/2023 12:16:20 PM This report has been signed electronically. Number of Addenda: 0

## 2023-05-28 NOTE — Anesthesia Preprocedure Evaluation (Signed)
Anesthesia Evaluation  Patient identified by MRN, date of birth, ID band Patient awake    Reviewed: Allergy & Precautions, H&P , NPO status , Patient's Chart, lab work & pertinent test results, reviewed documented beta blocker date and time   Airway Mallampati: II  TM Distance: >3 FB Neck ROM: full    Dental no notable dental hx.    Pulmonary neg pulmonary ROS   Pulmonary exam normal breath sounds clear to auscultation       Cardiovascular Exercise Tolerance: Good hypertension, negative cardio ROS  Rhythm:regular Rate:Normal     Neuro/Psych   Anxiety     negative neurological ROS  negative psych ROS   GI/Hepatic negative GI ROS, Neg liver ROS,,,  Endo/Other  negative endocrine ROS    Renal/GU negative Renal ROS  negative genitourinary   Musculoskeletal   Abdominal   Peds  Hematology negative hematology ROS (+)   Anesthesia Other Findings   Reproductive/Obstetrics negative OB ROS                             Anesthesia Physical Anesthesia Plan  ASA: 2  Anesthesia Plan: General   Post-op Pain Management:    Induction:   PONV Risk Score and Plan: Propofol infusion  Airway Management Planned:   Additional Equipment:   Intra-op Plan:   Post-operative Plan:   Informed Consent: I have reviewed the patients History and Physical, chart, labs and discussed the procedure including the risks, benefits and alternatives for the proposed anesthesia with the patient or authorized representative who has indicated his/her understanding and acceptance.     Dental Advisory Given  Plan Discussed with: CRNA  Anesthesia Plan Comments:        Anesthesia Quick Evaluation  

## 2023-05-28 NOTE — Transfer of Care (Signed)
 Immediate Anesthesia Transfer of Care Note  Patient: Ann Campbell  Procedure(s) Performed: COLONOSCOPY WITH PROPOFOL  POLYPECTOMY SUBMUCOSAL LIFTING INJECTION  Patient Location: Short Stay  Anesthesia Type:General  Level of Consciousness: awake, alert , oriented, and patient cooperative  Airway & Oxygen Therapy: Patient Spontanous Breathing  Post-op Assessment: Report given to RN, Post -op Vital signs reviewed and stable, and Patient moving all extremities X 4  Post vital signs: Reviewed and stable  Last Vitals:  Vitals Value Taken Time  BP 91/55 05/28/23 1206  Temp 36.5 C 05/28/23 1206  Pulse 70 05/28/23 1206  Resp 18 05/28/23 1206  SpO2 98 % 05/28/23 1206    Last Pain:  Vitals:   05/28/23 1206  TempSrc:   PainSc: 0-No pain      Patients Stated Pain Goal: 6 (05/28/23 1118)  Complications: No notable events documented.

## 2023-05-29 ENCOUNTER — Encounter (HOSPITAL_COMMUNITY): Payer: BC Managed Care – PPO

## 2023-05-29 ENCOUNTER — Ambulatory Visit (HOSPITAL_COMMUNITY): Payer: BC Managed Care – PPO

## 2023-05-30 ENCOUNTER — Encounter (HOSPITAL_COMMUNITY): Payer: Self-pay | Admitting: Gastroenterology

## 2023-05-30 ENCOUNTER — Encounter (INDEPENDENT_AMBULATORY_CARE_PROVIDER_SITE_OTHER): Payer: Self-pay | Admitting: *Deleted

## 2023-05-30 LAB — SURGICAL PATHOLOGY

## 2023-05-30 NOTE — Anesthesia Postprocedure Evaluation (Signed)
 Anesthesia Post Note  Patient: Ann Campbell  Procedure(s) Performed: COLONOSCOPY WITH PROPOFOL  POLYPECTOMY SUBMUCOSAL LIFTING INJECTION  Patient location during evaluation: Phase II Anesthesia Type: General Level of consciousness: awake Pain management: pain level controlled Vital Signs Assessment: post-procedure vital signs reviewed and stable Respiratory status: spontaneous breathing and respiratory function stable Cardiovascular status: blood pressure returned to baseline and stable Postop Assessment: no headache and no apparent nausea or vomiting Anesthetic complications: no Comments: Late entry   No notable events documented.   Last Vitals:  Vitals:   05/28/23 1118 05/28/23 1206  BP: 123/83 (!) 91/55  Pulse: 69 70  Resp: 17 18  Temp: 36.9 C 36.5 C  SpO2: 100% 98%    Last Pain:  Vitals:   05/28/23 1206  TempSrc:   PainSc: 0-No pain                 Yvonna JINNY Bosworth

## 2023-06-03 ENCOUNTER — Encounter (INDEPENDENT_AMBULATORY_CARE_PROVIDER_SITE_OTHER): Payer: Self-pay | Admitting: *Deleted

## 2023-07-17 ENCOUNTER — Other Ambulatory Visit: Payer: Self-pay | Admitting: Adult Health

## 2023-10-17 ENCOUNTER — Ambulatory Visit (INDEPENDENT_AMBULATORY_CARE_PROVIDER_SITE_OTHER): Admitting: Obstetrics and Gynecology

## 2023-10-17 ENCOUNTER — Other Ambulatory Visit (HOSPITAL_COMMUNITY)
Admission: RE | Admit: 2023-10-17 | Discharge: 2023-10-17 | Disposition: A | Discharge status: Home / Self Care | Source: Ambulatory Visit | Attending: Obstetrics and Gynecology | Admitting: Obstetrics and Gynecology

## 2023-10-17 ENCOUNTER — Encounter: Payer: Self-pay | Admitting: Obstetrics and Gynecology

## 2023-10-17 VITALS — BP 133/89 | HR 86 | Ht 65.0 in | Wt 169.0 lb

## 2023-10-17 DIAGNOSIS — N951 Menopausal and female climacteric states: Secondary | ICD-10-CM | POA: Diagnosis not present

## 2023-10-17 DIAGNOSIS — Z01419 Encounter for gynecological examination (general) (routine) without abnormal findings: Secondary | ICD-10-CM | POA: Diagnosis not present

## 2023-10-17 MED ORDER — EPINEPHRINE 0.3 MG/0.3ML IJ SOAJ: 0.3000 mg | INTRAMUSCULAR | 2 refills | Status: AC | PRN

## 2023-10-17 MED ORDER — SERTRALINE HCL 50 MG PO TABS: 50.0000 mg | ORAL_TABLET | Freq: Every day | ORAL | 1 refills | Status: AC

## 2023-10-17 MED ORDER — LO LOESTRIN FE 1 MG-10 MCG / 10 MCG PO TABS: 1.0000 | ORAL_TABLET | Freq: Every day | ORAL | 11 refills | Status: DC

## 2023-10-17 NOTE — Progress Notes (Signed)
 ANNUAL EXAM Patient name: Ann Campbell MRN [id number]  Date of birth: 19-Sep-1975 Chief Complaint:   Gynecologic Exam  History of Present Illness:   Ann Campbell is a 48 y.o. [id number] Caucasian female being seen today for a routine annual exam.  Current complaints: perimenopausal symptoms, Has been doing research and speaking with someone from [employer]. She was taking prometrium 100 and increasing it to 200 for 12 days near cycle, and estradiol cream as needed. She is still have a normal monthly cycle. She reports brain fog, fatigue, mood fluctuation, skin changes, She is not having hot flashes or night sweats. She eats healthy/balanced diet, she works out regularly.   Last pap 07/22/22. Results were: NILM w/ HRHPV negative. H/O abnormal pap: yes Last mammogram: 04/2023. Results were: normal. Family h/o breast cancer: no Last colonoscopy: 05/2023.     10/17/2023    8:47 AM 07/22/2022    2:43 PM 07/18/2021   11:02 AM 01/27/2020    1:47 PM 05/08/2018   11:17 AM  Depression screen PHQ 2/9  Decreased Interest 0 0 0 0 0  Down, Depressed, Hopeless 0 0 0 0 0  PHQ - 2 Score 0 0 0 0 0  Altered sleeping 0 0 0 0   Tired, decreased energy 0 0 0 0   Change in appetite 0 0 0 0   Feeling bad or failure about yourself  0 0 0 0   Trouble concentrating 0 0 0 0   Moving slowly or fidgety/restless 0 0 0 0   Suicidal thoughts 0 0 0 0   PHQ-9 Score 0 0 0 0   Difficult doing work/chores    Not difficult at all         10/17/2023    8:48 AM 07/22/2022    2:43 PM 07/18/2021   11:02 AM 01/27/2020    1:48 PM  GAD 7 : Generalized Anxiety Score  Nervous, Anxious, on Edge 0 1 0 0  Control/stop worrying 0 0 0 0  Worry too much - different things 0 0 0 0  Trouble relaxing 0 0 0 0  Restless 0 0 0 0  Easily annoyed or irritable 0 1 0 1  Afraid - awful might happen 0 0 0 0  Total GAD 7 Score 0 2 0 1  Anxiety Difficulty    Not difficult at all     Review of Systems:   Pertinent items are noted in HPI Denies  any headaches, blurred vision, fatigue, shortness of breath, chest pain, abdominal pain, abnormal vaginal discharge/itching/odor/irritation, problems with periods, bowel movements, urination, or intercourse unless otherwise stated above. Pertinent History Reviewed:  Reviewed past medical,surgical, social and family history.  Reviewed problem list, medications and allergies. Physical Assessment:   Vitals:   10/17/23 0844  BP: 133/89  Pulse: 86  Weight: 169 lb (76.7 kg)  Height: 5\' 5"  (1.651 m)  Body mass index is 28.12 kg/m.        Physical Examination:   General appearance - well appearing, and in no distress  Mental status - alert, oriented   Psych:  She has a normal mood and affect  Skin - warm and dry, normal color  Chest - effort normal, all lung fields clear to auscultation bilaterally  Heart - normal rate and regular rhythm  Neck:  midline trachea, no thyromegaly  Breasts - breasts appear normal, no suspicious masses, no skin or nipple changes or  axillary nodes  Abdomen - soft, nontender  Pelvic -  VULVA: normal appearing vulva with no masses, tenderness or lesions  VAGINA: normal appearing vagina with normal color and discharge, no lesions  CERVIX: normal appearing cervix without discharge or lesions, no CMT  UTERUS: uterus is felt to be normal size, shape, consistency and nontender   ADNEXA: No adnexal masses or tenderness noted.  Extremities:  No swelling or varicosities noted  Chaperone present for exam  No results found for this or any previous visit (from the past 24 hours).  Assessment & Plan:  1. Encounter for annual routine gynecological examination (Primary) Discussed guidelines regarding pap smear, desires pap today Mammogram in December Does not see pcp regularly, desires routine bloodwork  Refill sent for epi pen  2. Perimenopause Discussed options for treatment, had questions about conjugated glp-1 and testosterone Discussed trial of low dose COC instead  of prometrium while she still has regular cycle Will trial lo loestrin  and follow up to reassess symptoms Continue zoloft , refill sent   Labs/procedures today:   Mammogram: follow up in December , or sooner if problems Colonoscopy: per GI, or sooner if problems  Orders Placed This Encounter  Procedures   CBC   Lipid panel   HgB A1c   Comp Met (CMET)    Meds:  Meds ordered this encounter  Medications   EPINEPHrine  0.3 mg/0.3 mL IJ SOAJ injection    Sig: Inject 0.3 mg into the muscle as needed for anaphylaxis.    Dispense:  1 each    Refill:  2   LO LOESTRIN FE  1 MG-10 MCG / 10 MCG tablet    Sig: Take 1 tablet by mouth daily.    Dispense:  28 tablet    Refill:  11    Submit other coverage code 3    BIN: [id number]  PCN: [id number]    GRP: [id number]    ID: [id number]   sertraline  (ZOLOFT ) 50 MG tablet    Sig: Take 1 tablet (50 mg total) by mouth daily.    Dispense:  90 tablet    Refill:  1    Follow-up: Return in 3 months for follow up  Susi Eric, FNP

## 2023-10-22 ENCOUNTER — Ambulatory Visit: Payer: Self-pay | Admitting: Obstetrics and Gynecology

## 2023-10-22 LAB — CYTOLOGY - PAP
Adequacy: ABSENT
Comment: NEGATIVE
Diagnosis: NEGATIVE
High risk HPV: NEGATIVE

## 2023-11-12 ENCOUNTER — Other Ambulatory Visit: Payer: Self-pay | Admitting: Adult Health

## 2024-01-16 ENCOUNTER — Ambulatory Visit: Admitting: Adult Health

## 2024-02-04 ENCOUNTER — Ambulatory Visit: Admitting: Adult Health

## 2024-03-03 ENCOUNTER — Encounter (INDEPENDENT_AMBULATORY_CARE_PROVIDER_SITE_OTHER): Payer: Self-pay | Admitting: Gastroenterology

## 2024-03-08 ENCOUNTER — Ambulatory Visit: Admitting: Adult Health

## 2024-03-31 ENCOUNTER — Ambulatory Visit: Admitting: Adult Health

## 2024-04-27 ENCOUNTER — Other Ambulatory Visit (HOSPITAL_COMMUNITY): Payer: Self-pay | Admitting: Adult Health

## 2024-04-27 DIAGNOSIS — Z1231 Encounter for screening mammogram for malignant neoplasm of breast: Secondary | ICD-10-CM

## 2024-04-28 ENCOUNTER — Ambulatory Visit: Admitting: Adult Health

## 2024-05-06 ENCOUNTER — Encounter (INDEPENDENT_AMBULATORY_CARE_PROVIDER_SITE_OTHER): Payer: Self-pay | Admitting: *Deleted

## 2024-05-26 ENCOUNTER — Ambulatory Visit (HOSPITAL_COMMUNITY)
Admission: RE | Admit: 2024-05-26 | Discharge: 2024-05-26 | Disposition: A | Discharge status: Home / Self Care | Source: Ambulatory Visit | Attending: Adult Health | Admitting: Adult Health

## 2024-05-26 ENCOUNTER — Encounter (HOSPITAL_COMMUNITY): Payer: Self-pay

## 2024-05-26 DIAGNOSIS — Z1231 Encounter for screening mammogram for malignant neoplasm of breast: Secondary | ICD-10-CM | POA: Diagnosis present

## 2024-05-28 ENCOUNTER — Other Ambulatory Visit (HOSPITAL_COMMUNITY): Payer: Self-pay | Admitting: Adult Health

## 2024-05-28 DIAGNOSIS — R928 Other abnormal and inconclusive findings on diagnostic imaging of breast: Secondary | ICD-10-CM

## 2024-05-31 ENCOUNTER — Ambulatory Visit: Payer: Self-pay | Admitting: Adult Health

## 2024-06-01 ENCOUNTER — Ambulatory Visit: Payer: Self-pay | Admitting: Adult Health

## 2024-06-01 ENCOUNTER — Ambulatory Visit (HOSPITAL_COMMUNITY)
Admission: RE | Admit: 2024-06-01 | Discharge: 2024-06-01 | Disposition: A | Discharge status: Home / Self Care | Source: Ambulatory Visit | Attending: Adult Health | Admitting: Adult Health

## 2024-06-01 ENCOUNTER — Encounter (HOSPITAL_COMMUNITY): Payer: Self-pay

## 2024-06-01 ENCOUNTER — Ambulatory Visit (HOSPITAL_COMMUNITY): Admission: RE | Admit: 2024-06-01 | Discharge: 2024-06-01 | Attending: Adult Health | Admitting: Adult Health

## 2024-06-01 DIAGNOSIS — R928 Other abnormal and inconclusive findings on diagnostic imaging of breast: Secondary | ICD-10-CM

## 2024-06-05 ENCOUNTER — Encounter

## 2024-06-05 ENCOUNTER — Other Ambulatory Visit

## 2024-06-09 ENCOUNTER — Telehealth (INDEPENDENT_AMBULATORY_CARE_PROVIDER_SITE_OTHER): Payer: Self-pay

## 2024-06-09 DIAGNOSIS — Z860100 Personal history of colon polyps, unspecified: Secondary | ICD-10-CM

## 2024-06-09 DIAGNOSIS — Z800 Family history of malignant neoplasm of digestive organs: Secondary | ICD-10-CM

## 2024-06-09 NOTE — Telephone Encounter (Signed)
Ok to schedule.  Room :Any   Thanks,  Vista Lawman, MD Gastroenterology and Hepatology [facility]

## 2024-06-09 NOTE — Telephone Encounter (Signed)
 Who is your primary care physician: Delon Lewis  Reasons for the colonoscopy: history of colon polyps, family history colon cancer, screening  Have you had a colonoscopy before?  Yes, 05/28/2023  Do you have family history of colon cancer? Yes, grandmother  Previous colonoscopy with polyps removed? Yes, 05/28/2023  Do you have a history colorectal cancer?   no  Are you diabetic? If yes, Type 1 or Type 2?    no  Do you have a prosthetic or mechanical heart valve? no  Do you have a pacemaker/defibrillator?   no  Have you had endocarditis/atrial fibrillation? no  Have you had joint replacement within the last 12 months?  no  Do you tend to be constipated or have to use laxatives? no  Do you have any history of drugs or alcohol?  no  Do you use supplemental oxygen?  no  Have you had a stroke or heart attack within the last 6 months? no  Do you take weight loss medication?  yes  For female patients: have you had a hysterectomy?  no                                     are you post menopausal?       no                                            do you still have your menstrual cycle? yes      Do you take any blood-thinning medications such as: (aspirin, warfarin, Plavix, Aggrenox)  no  If yes we need the name, milligram, dosage and who is prescribing doctor   Current Outpatient Medications  Medication Sig Dispense Refill   EPINEPHrine  0.3 mg/0.3 mL IJ SOAJ injection Inject 0.3 mg into the muscle as needed for anaphylaxis. 1 each 2   estradiol (ESTRACE) 0.1 MG/GM vaginal cream Place 1 Applicatorful vaginally at bedtime as needed (pt prefrence).     LIRAGLUTIDE -WEIGHT MANAGEMENT [place] Inject 3 mg into the skin daily.     loratadine (CLARITIN) 10 MG tablet Take 10 mg by mouth daily.     progesterone (PROMETRIUM) 100 MG capsule Take 100 mg by mouth daily.     sertraline  (ZOLOFT ) 50 MG tablet Take 1 tablet (50 mg total) by mouth daily. 90 tablet 1   No current facility-administered  medications for this visit.    Allergies[1]  Pharmacy: [facility]  Primary Insurance Name: [employer]  Best number where you can be reached: [phone]     [1]  Allergies Allergen Reactions   Bee Venom Anaphylaxis and Hives   Augmentin  [Amoxicillin -Pot Clavulanate]     GI Intolerance   Sulfa Antibiotics Rash

## 2024-06-10 MED ORDER — NA SULFATE-K SULFATE-MG SULF 17.5-3.13-1.6 GM/177ML PO SOLN: 1.0000 | ORAL | 0 refills | Status: AC

## 2024-06-10 NOTE — Telephone Encounter (Signed)
 Spoke with pt. She has been scheduled for 2/18. Aware will send instructions via mychart. She requested another prep other than trylite. Sent in suprep.

## 2024-06-10 NOTE — Addendum Note (Signed)
 Addended by: JEANELL GRAEME RAMAN on: 06/10/2024 04:13 PM   Modules accepted: Orders

## 2024-06-11 ENCOUNTER — Encounter: Payer: Self-pay | Admitting: *Deleted

## 2024-06-11 NOTE — Telephone Encounter (Signed)
 Pt called to reschedule her procedure on 07/07/24 because she couldn't find a ride for that day. She has been rescheduled to 07/13/24 at 9:30 am.  Updated instructions sent via MyChart.

## 2024-06-11 NOTE — Telephone Encounter (Signed)
 PA approved via [employer] Order ID: [id number]       Authorized Approval Valid Through: 06/11/2024 - 09/08/2024

## 2024-06-11 NOTE — Telephone Encounter (Signed)
 Questionnaire from recall, no referral needed

## 2024-06-22 ENCOUNTER — Other Ambulatory Visit (HOSPITAL_COMMUNITY)

## 2024-06-22 ENCOUNTER — Encounter (HOSPITAL_COMMUNITY)

## 2024-07-06 ENCOUNTER — Ambulatory Visit: Admitting: Adult Health

## 2024-07-13 ENCOUNTER — Encounter (HOSPITAL_COMMUNITY): Payer: Self-pay

## 2024-07-13 ENCOUNTER — Ambulatory Visit (HOSPITAL_COMMUNITY): Admit: 2024-07-13 | Admitting: Gastroenterology
